# Patient Record
Sex: Female | Born: 1975 | Race: White | Hispanic: No | Marital: Married | Smoking: Never smoker
Health system: Southern US, Community
[De-identification: ages and names within clinical notes are randomized; demographics above are authoritative.]

## PROBLEM LIST (undated history)

## (undated) DIAGNOSIS — R000 Tachycardia, unspecified: Secondary | ICD-10-CM

## (undated) DIAGNOSIS — G90A Postural orthostatic tachycardia syndrome (POTS): Secondary | ICD-10-CM

## (undated) DIAGNOSIS — G43909 Migraine, unspecified, not intractable, without status migrainosus: Secondary | ICD-10-CM

## (undated) DIAGNOSIS — E162 Hypoglycemia, unspecified: Secondary | ICD-10-CM

## (undated) DIAGNOSIS — F32A Depression, unspecified: Secondary | ICD-10-CM

## (undated) DIAGNOSIS — G901 Familial dysautonomia [Riley-Day]: Secondary | ICD-10-CM

## (undated) DIAGNOSIS — F419 Anxiety disorder, unspecified: Secondary | ICD-10-CM

## (undated) DIAGNOSIS — S069XAA Unspecified intracranial injury with loss of consciousness status unknown, initial encounter: Secondary | ICD-10-CM

## (undated) HISTORY — PX: TUBAL LIGATION: SHX77

## (undated) HISTORY — DX: Depression, unspecified: F32.A

## (undated) HISTORY — PX: OOPHORECTOMY: SHX86

## (undated) HISTORY — PX: ABDOMINAL HYSTERECTOMY: SHX81

---

## 1998-01-20 ENCOUNTER — Ambulatory Visit (HOSPITAL_COMMUNITY): Admission: RE | Admit: 1998-01-20 | Discharge: 1998-01-20 | Payer: Self-pay | Admitting: Family Medicine

## 1998-03-20 ENCOUNTER — Inpatient Hospital Stay (HOSPITAL_COMMUNITY): Admission: AD | Admit: 1998-03-20 | Discharge: 1998-03-20 | Payer: Self-pay | Admitting: Obstetrics & Gynecology

## 1998-10-27 ENCOUNTER — Observation Stay (HOSPITAL_COMMUNITY): Admission: AD | Admit: 1998-10-27 | Discharge: 1998-10-28 | Payer: Self-pay | Admitting: Obstetrics & Gynecology

## 1998-12-26 ENCOUNTER — Ambulatory Visit (HOSPITAL_COMMUNITY): Admission: RE | Admit: 1998-12-26 | Discharge: 1998-12-26 | Payer: Self-pay | Admitting: Obstetrics and Gynecology

## 1998-12-26 ENCOUNTER — Encounter: Payer: Self-pay | Admitting: Obstetrics and Gynecology

## 1999-05-14 ENCOUNTER — Inpatient Hospital Stay (HOSPITAL_COMMUNITY): Admission: AD | Admit: 1999-05-14 | Discharge: 1999-05-17 | Payer: Self-pay | Admitting: Obstetrics and Gynecology

## 1999-11-17 ENCOUNTER — Other Ambulatory Visit: Admission: RE | Admit: 1999-11-17 | Discharge: 1999-11-17 | Payer: Self-pay | Admitting: Family Medicine

## 2001-02-19 ENCOUNTER — Other Ambulatory Visit: Admission: RE | Admit: 2001-02-19 | Discharge: 2001-02-19 | Payer: Self-pay | Admitting: Emergency Medicine

## 2001-06-01 ENCOUNTER — Emergency Department (HOSPITAL_COMMUNITY): Admission: EM | Admit: 2001-06-01 | Discharge: 2001-06-01 | Payer: Self-pay | Admitting: Emergency Medicine

## 2001-11-09 ENCOUNTER — Emergency Department (HOSPITAL_COMMUNITY): Admission: EM | Admit: 2001-11-09 | Discharge: 2001-11-09 | Payer: Self-pay | Admitting: Emergency Medicine

## 2001-11-11 ENCOUNTER — Emergency Department (HOSPITAL_COMMUNITY): Admission: EM | Admit: 2001-11-11 | Discharge: 2001-11-11 | Payer: Self-pay | Admitting: Emergency Medicine

## 2001-12-05 ENCOUNTER — Inpatient Hospital Stay (HOSPITAL_COMMUNITY): Admission: EM | Admit: 2001-12-05 | Discharge: 2001-12-07 | Payer: Self-pay | Admitting: Emergency Medicine

## 2002-02-17 ENCOUNTER — Observation Stay (HOSPITAL_COMMUNITY): Admission: AD | Admit: 2002-02-17 | Discharge: 2002-02-18 | Payer: Self-pay | Admitting: Obstetrics and Gynecology

## 2002-02-24 ENCOUNTER — Other Ambulatory Visit: Admission: RE | Admit: 2002-02-24 | Discharge: 2002-02-24 | Payer: Self-pay | Admitting: Obstetrics and Gynecology

## 2002-06-06 ENCOUNTER — Inpatient Hospital Stay (HOSPITAL_COMMUNITY): Admission: AD | Admit: 2002-06-06 | Discharge: 2002-06-06 | Payer: Self-pay | Admitting: Obstetrics and Gynecology

## 2002-08-17 ENCOUNTER — Encounter: Payer: Self-pay | Admitting: Obstetrics and Gynecology

## 2002-08-17 ENCOUNTER — Inpatient Hospital Stay (HOSPITAL_COMMUNITY): Admission: AD | Admit: 2002-08-17 | Discharge: 2002-08-17 | Payer: Self-pay | Admitting: Obstetrics and Gynecology

## 2002-08-23 ENCOUNTER — Inpatient Hospital Stay (HOSPITAL_COMMUNITY): Admission: AD | Admit: 2002-08-23 | Discharge: 2002-08-23 | Payer: Self-pay | Admitting: Obstetrics and Gynecology

## 2002-08-24 ENCOUNTER — Encounter: Payer: Self-pay | Admitting: Obstetrics and Gynecology

## 2002-08-24 ENCOUNTER — Inpatient Hospital Stay (HOSPITAL_COMMUNITY): Admission: AD | Admit: 2002-08-24 | Discharge: 2002-08-24 | Payer: Self-pay | Admitting: Obstetrics and Gynecology

## 2002-09-11 ENCOUNTER — Inpatient Hospital Stay (HOSPITAL_COMMUNITY): Admission: AD | Admit: 2002-09-11 | Discharge: 2002-09-14 | Payer: Self-pay | Admitting: Obstetrics and Gynecology

## 2002-09-13 ENCOUNTER — Encounter (INDEPENDENT_AMBULATORY_CARE_PROVIDER_SITE_OTHER): Payer: Self-pay | Admitting: Specialist

## 2003-08-27 ENCOUNTER — Emergency Department (HOSPITAL_COMMUNITY): Admission: EM | Admit: 2003-08-27 | Discharge: 2003-08-28 | Payer: Self-pay | Admitting: Emergency Medicine

## 2003-08-31 ENCOUNTER — Ambulatory Visit (HOSPITAL_COMMUNITY): Admission: RE | Admit: 2003-08-31 | Discharge: 2003-08-31 | Payer: Self-pay | Admitting: Family Medicine

## 2003-10-14 ENCOUNTER — Inpatient Hospital Stay (HOSPITAL_COMMUNITY): Admission: RE | Admit: 2003-10-14 | Discharge: 2003-10-18 | Payer: Self-pay | Admitting: Obstetrics and Gynecology

## 2003-10-14 ENCOUNTER — Encounter (INDEPENDENT_AMBULATORY_CARE_PROVIDER_SITE_OTHER): Payer: Self-pay | Admitting: Specialist

## 2004-03-29 ENCOUNTER — Ambulatory Visit (HOSPITAL_COMMUNITY): Admission: RE | Admit: 2004-03-29 | Discharge: 2004-03-29 | Payer: Self-pay | Admitting: Obstetrics and Gynecology

## 2004-04-02 ENCOUNTER — Emergency Department (HOSPITAL_COMMUNITY): Admission: EM | Admit: 2004-04-02 | Discharge: 2004-04-03 | Payer: Self-pay | Admitting: Emergency Medicine

## 2004-04-28 ENCOUNTER — Emergency Department (HOSPITAL_COMMUNITY): Admission: EM | Admit: 2004-04-28 | Discharge: 2004-04-28 | Payer: Self-pay | Admitting: Emergency Medicine

## 2004-05-07 ENCOUNTER — Emergency Department (HOSPITAL_COMMUNITY): Admission: EM | Admit: 2004-05-07 | Discharge: 2004-05-07 | Payer: Self-pay | Admitting: Emergency Medicine

## 2004-10-12 ENCOUNTER — Emergency Department (HOSPITAL_COMMUNITY): Admission: EM | Admit: 2004-10-12 | Discharge: 2004-10-12 | Payer: Self-pay | Admitting: Emergency Medicine

## 2005-03-07 ENCOUNTER — Other Ambulatory Visit: Admission: RE | Admit: 2005-03-07 | Discharge: 2005-03-07 | Payer: Self-pay | Admitting: Obstetrics and Gynecology

## 2005-03-28 ENCOUNTER — Ambulatory Visit (HOSPITAL_COMMUNITY): Admission: RE | Admit: 2005-03-28 | Discharge: 2005-03-28 | Payer: Self-pay | Admitting: Obstetrics and Gynecology

## 2005-11-05 IMAGING — US US TRANSVAGINAL NON-OB
1 series · 18 of 25 positions shown · non-contrast
Comparison: none

CLINICAL DATA: Pelvic pain.
 TRANSABDOMINAL AND TRANSVAGINAL PELVIC ULTRASOUND:
 The uterus is normal in size measuring approximately 9.3 cm in length.  No fibroids or other uterine masses are seen.  The endometrium has a double-layer thickness of 7 mm on transvaginal sonography which is within normal limits for a premenopausal patient.
 A complex mass is seen in the left adnexal which has both cystic and solid components.  The solid component is hyperechoic with acoustic shadowing, consistent with internal fat.  No left ovary is identified separate from this mass by either transabdominal or transvaginal sonography.  This mass measures 7.1 x 5.0 x 6.1 cm and is consistent with a left ovarian dermoid.  
 The right ovary was visualized and is normal in appearance.  There is no evidence of free pelvic fluid.  
 IMPRESSION
 Complex mass in the left adnexal measuring 7 cm, which has features consistent with a left ovarian dermoid.  
 Normal appearance of the uterus and right ovary.

[Series 1: us pelvis complete modify · 18 of 47 slices shown]
[im 1/47]
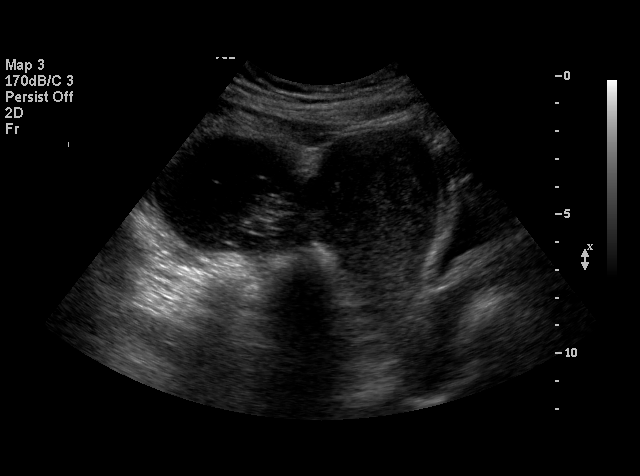
[im 4/47]
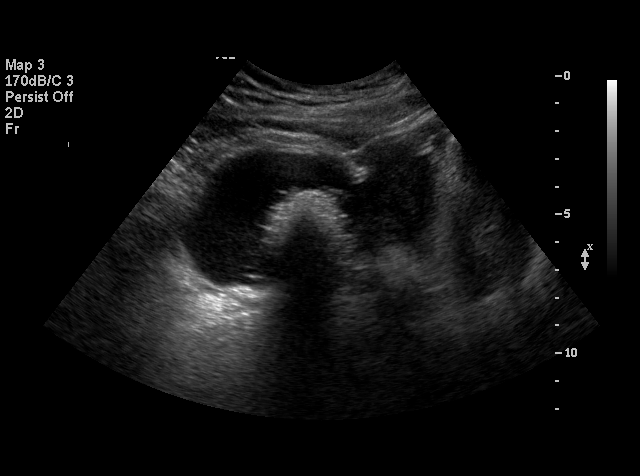
[im 6/47]
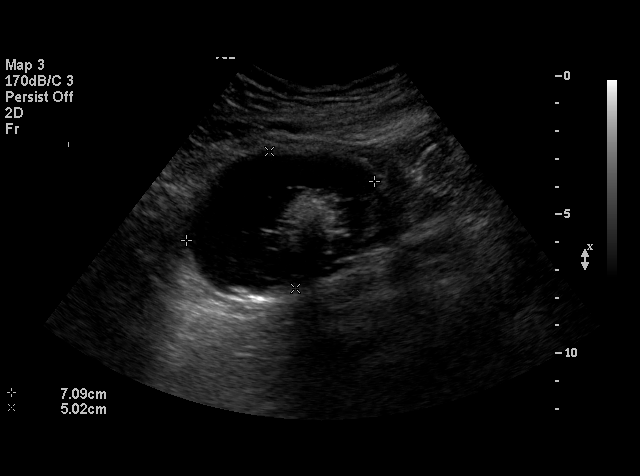
[im 8/47]
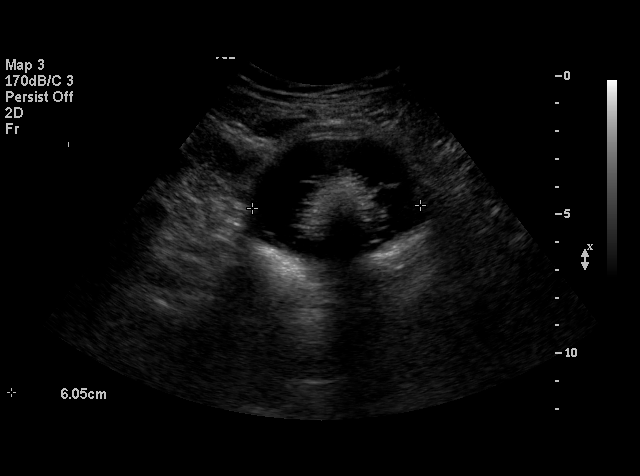
[im 12/47]
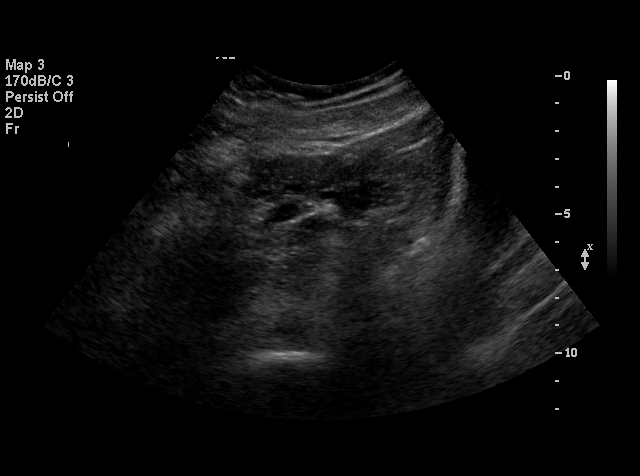
[im 14/47]
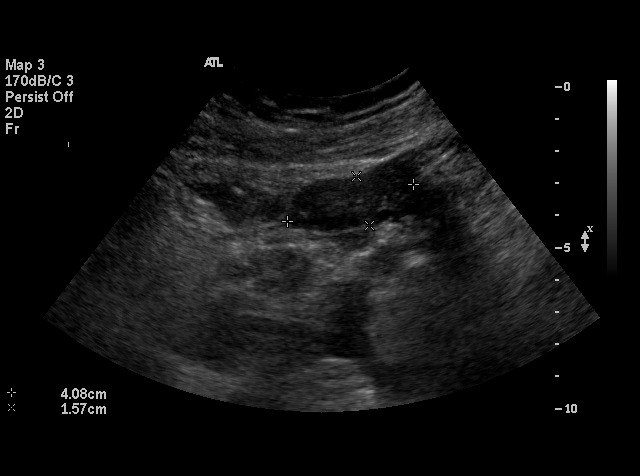
[im 18/47]
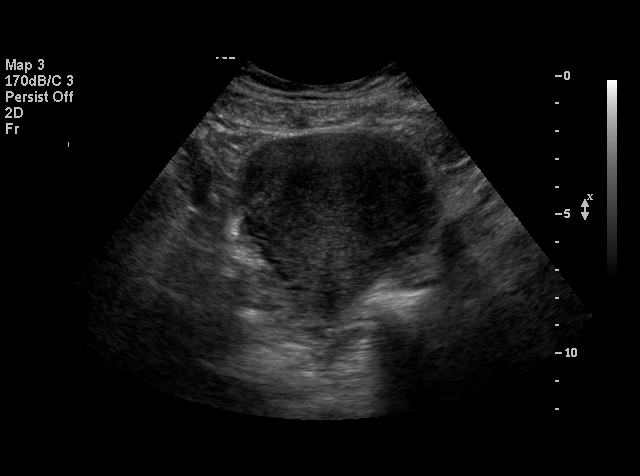
[im 20/47]
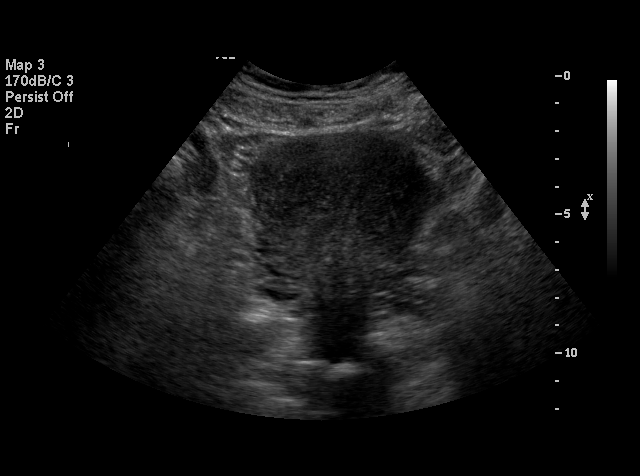
[im 22/47]
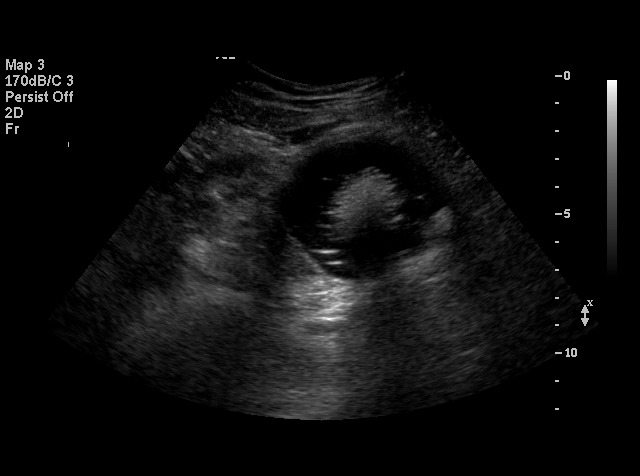
[im 25/47]
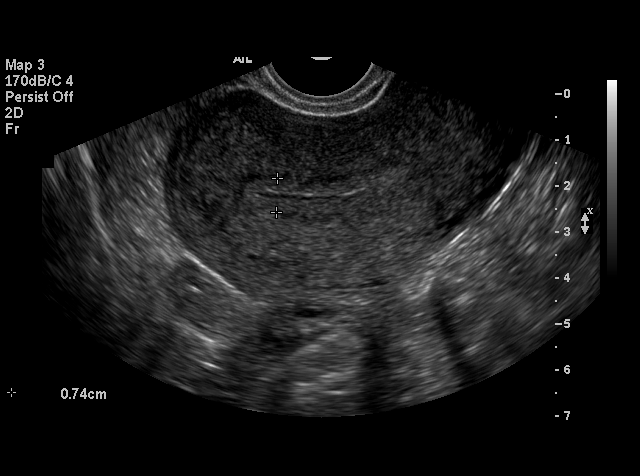
[im 27/47]
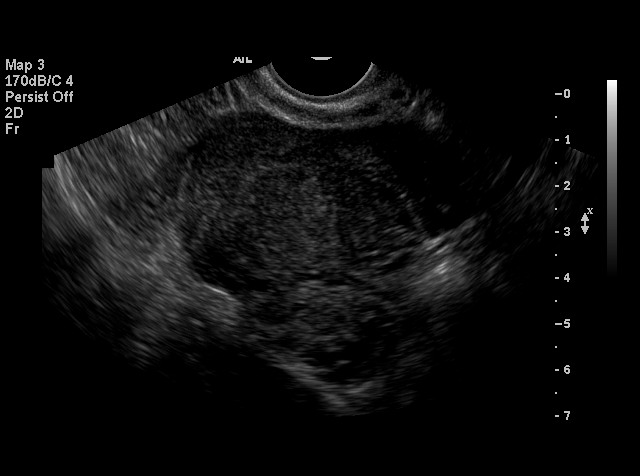
[im 29/47]
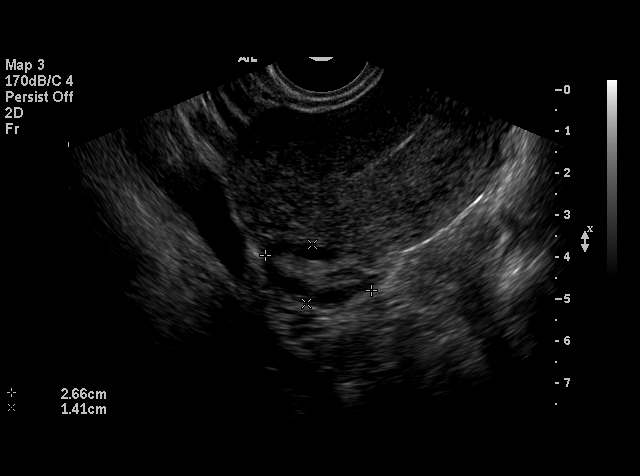
[im 33/47]
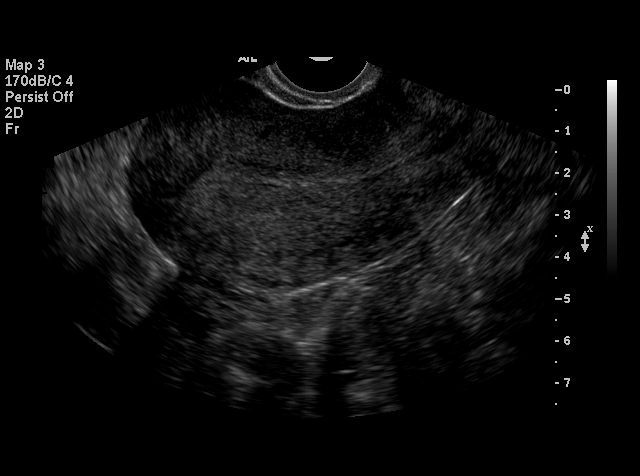
[im 35/47]
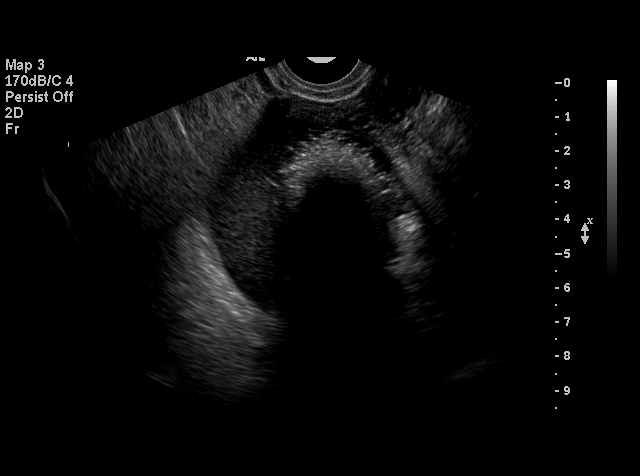
[im 39/47]
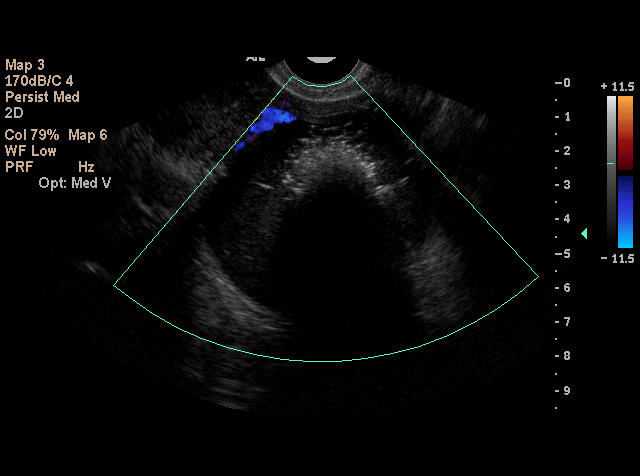
[im 41/47]
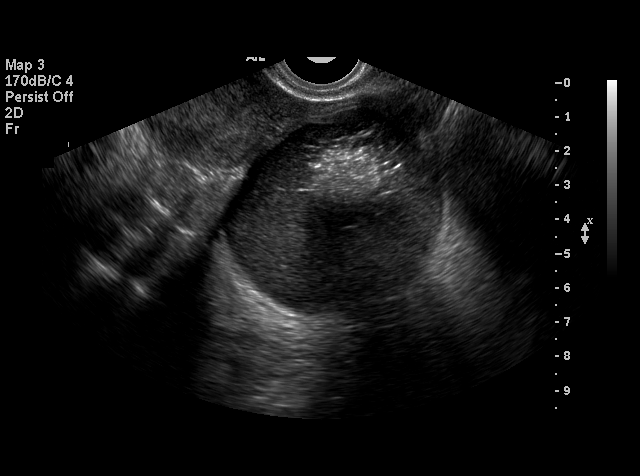
[im 43/47]
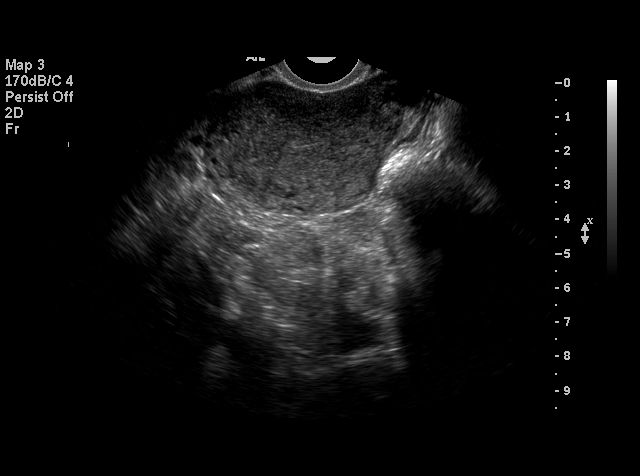
[im 47/47]
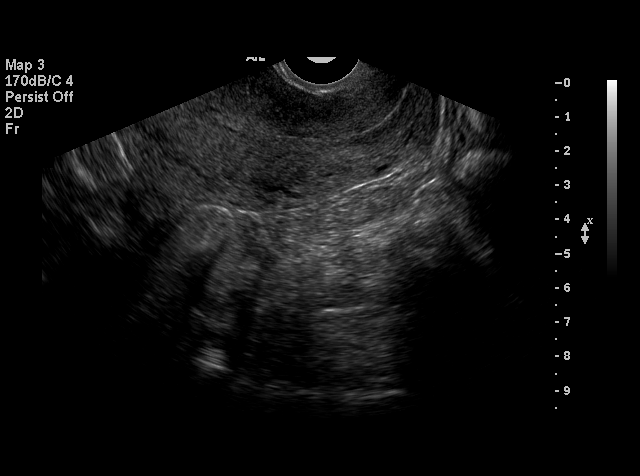

[18 of 25 positions shown; findings below may reference images not displayed]

## 2006-06-04 IMAGING — US US PELVIS COMPLETE MODIFY
1 series · 18 of 25 positions shown · non-contrast
Comparison: none

CLINICAL DATA: 28 year-old with abdominal pain
 TRANSVAGINAL NON-OB AND PELVIS COMPLETE MODIFIED
 Transabdominal and endovaginal images are performed of the pelvis.  Comparison is made with study on 08/31/03.  Patient reports having had a left ovarian tumor removed since that study.  The uterus is 9.7 x 5.1 x 5.7cm.  The endometrial stripe is homogeneous and measures 10mm.  
 Right ovary has a normal appearance measuring 7 x 1.9 x 2.5cm.  The left ovary could not be visualized.  No free pelvic fluid or adnexal mass identified.
 IMPRESSION
 Normal-appearing uterus and right ovary.  The left ovary is not visualized.

[Series 1: us transvaginal non-ob · 18 of 27 slices shown]
[im 1/27]
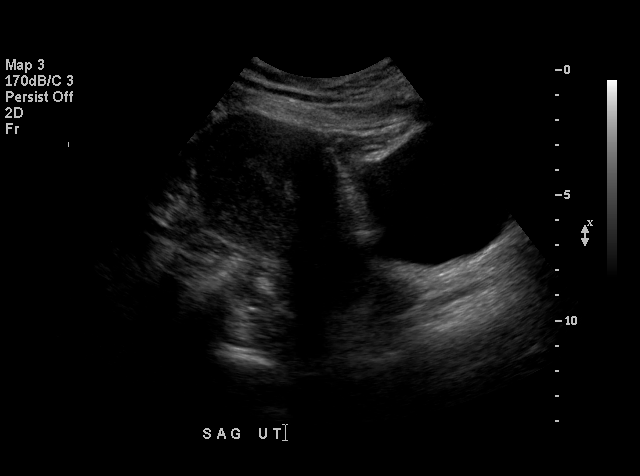
[im 3/27]
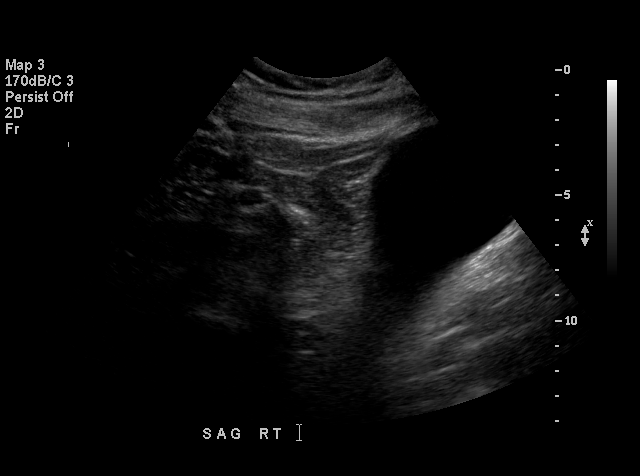
[im 4/27]
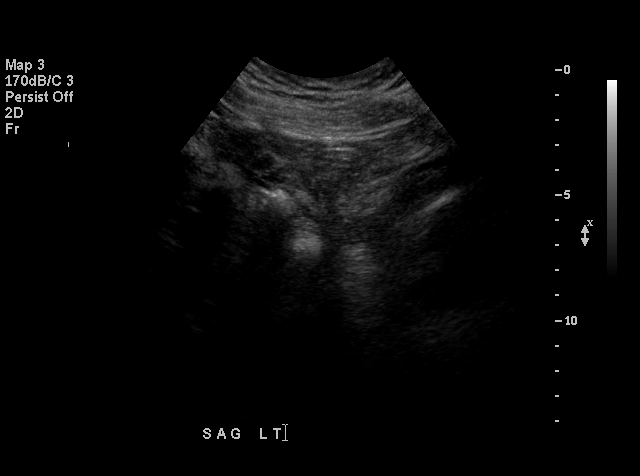
[im 5/27]
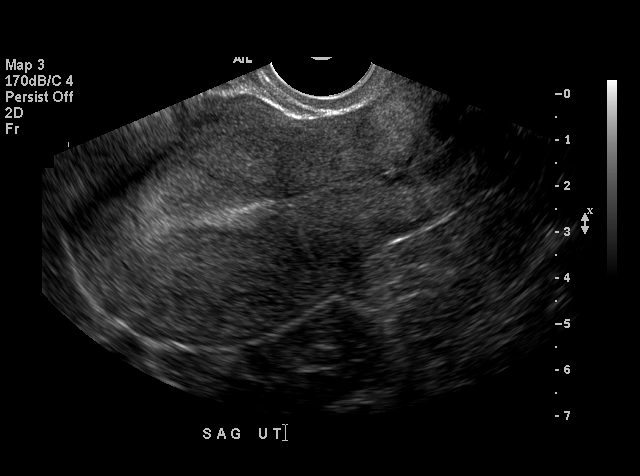
[im 7/27]
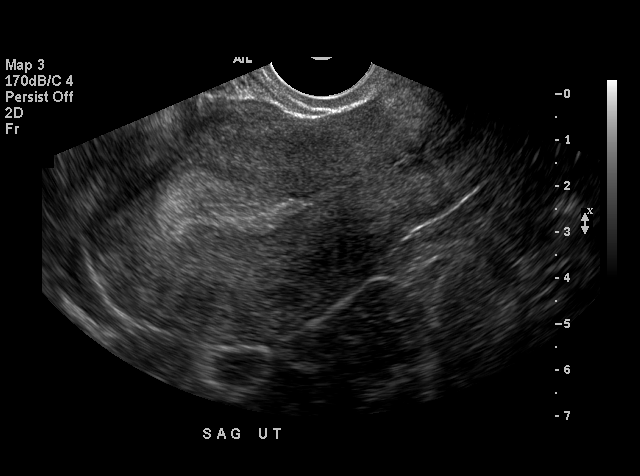
[im 8/27]
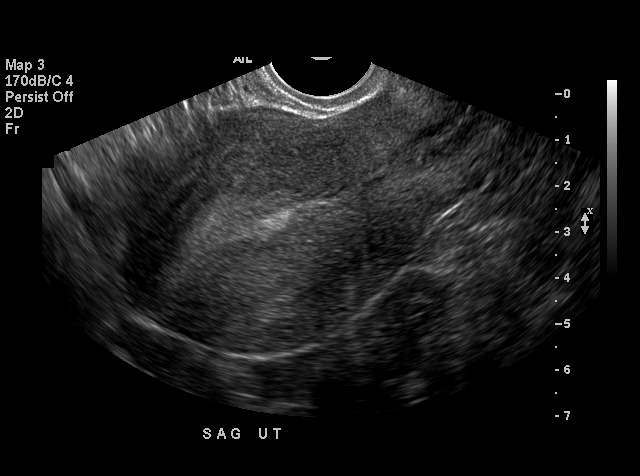
[im 10/27]
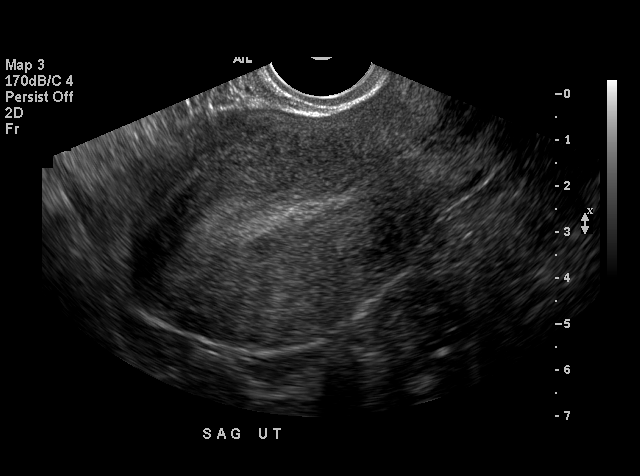
[im 11/27]
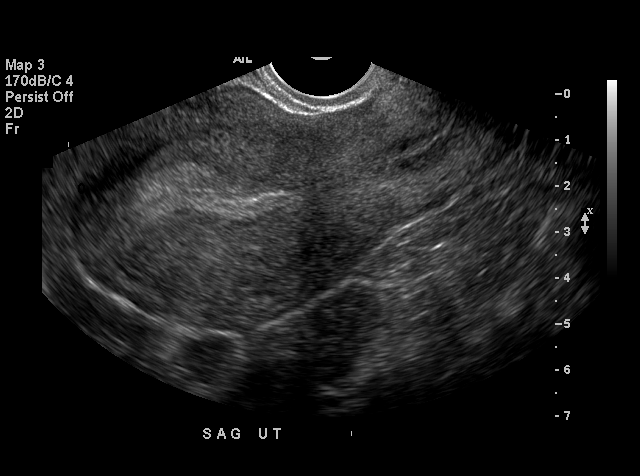
[im 12/27]
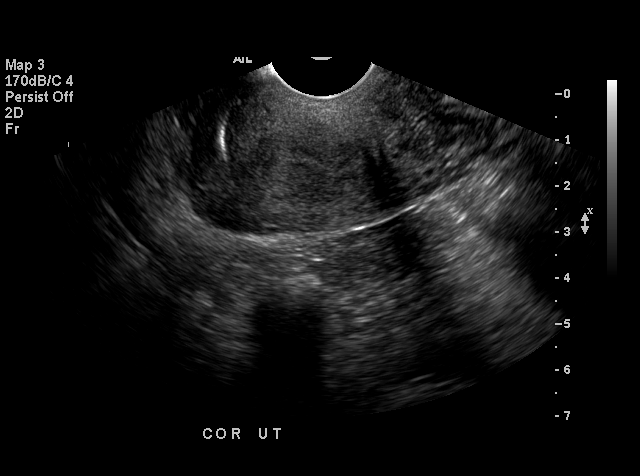
[im 15/27]
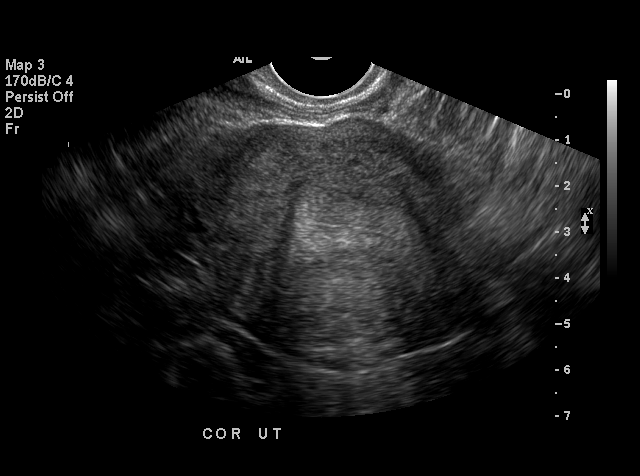
[im 16/27]
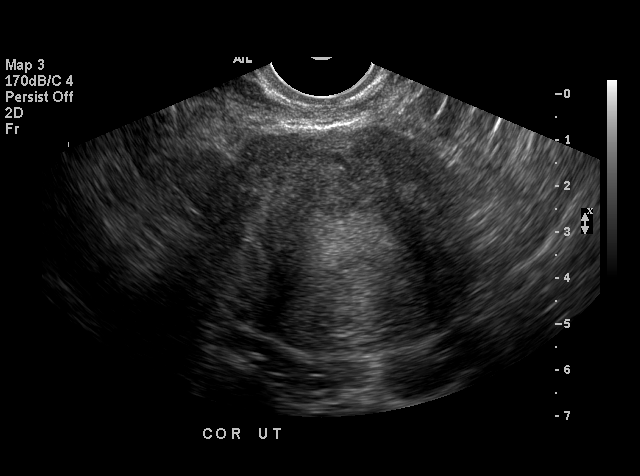
[im 17/27]
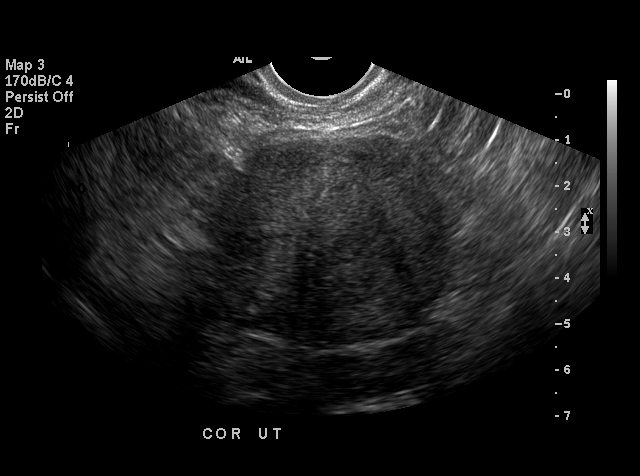
[im 19/27]
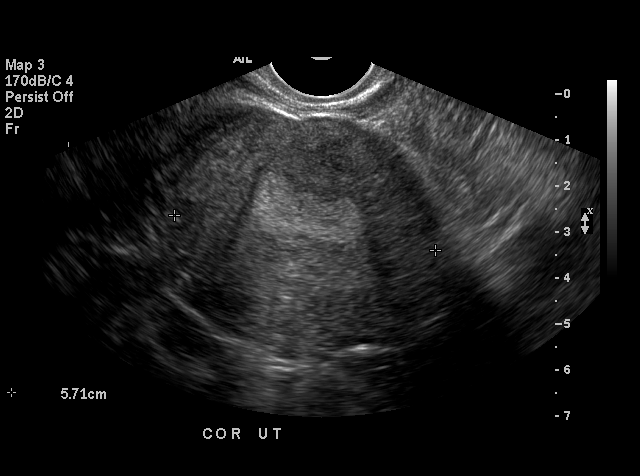
[im 20/27]
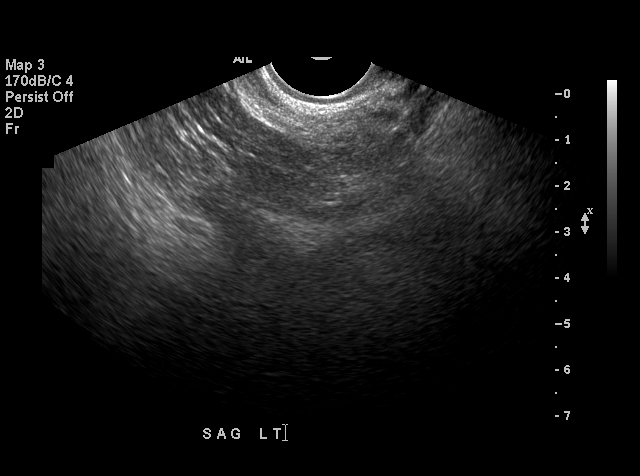
[im 22/27]
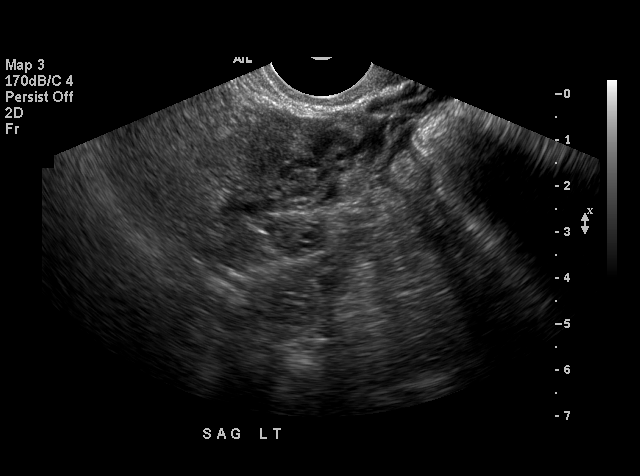
[im 23/27]
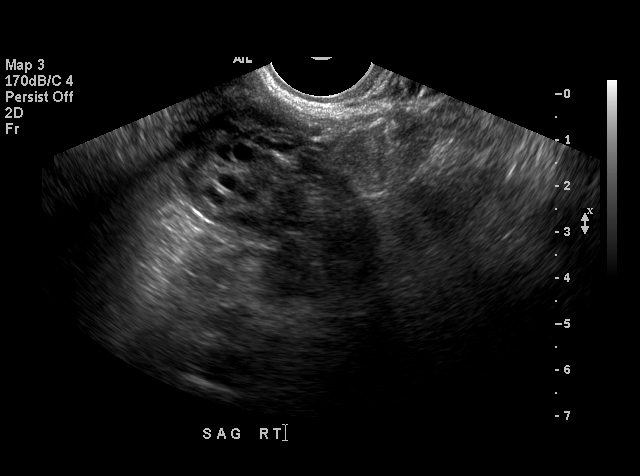
[im 24/27]
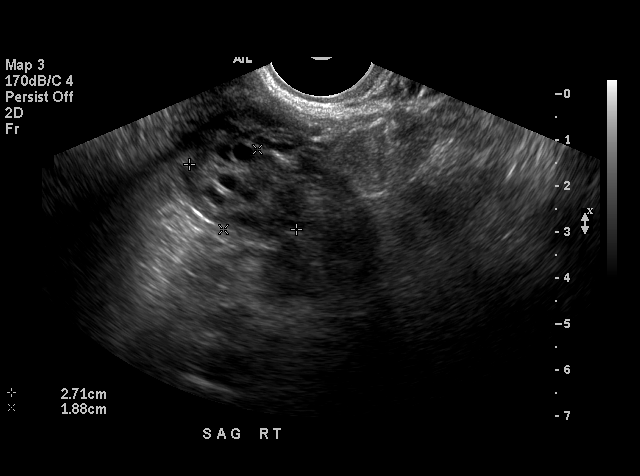
[im 27/27]
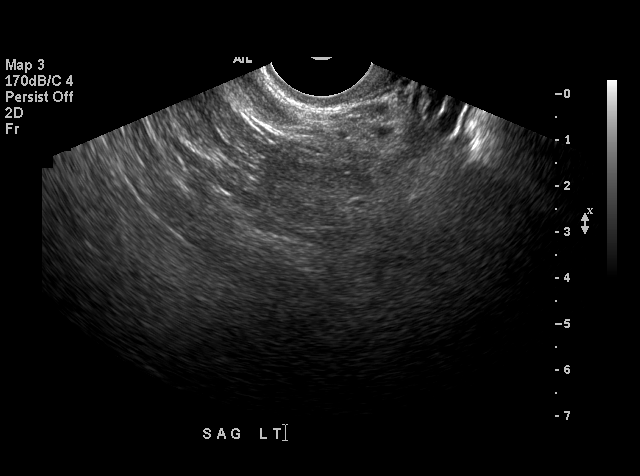

[18 of 25 positions shown; findings below may reference images not displayed]

## 2009-02-04 ENCOUNTER — Emergency Department (HOSPITAL_COMMUNITY): Admission: EM | Admit: 2009-02-04 | Discharge: 2009-02-04 | Payer: Self-pay | Admitting: Emergency Medicine

## 2009-06-15 ENCOUNTER — Emergency Department (HOSPITAL_BASED_OUTPATIENT_CLINIC_OR_DEPARTMENT_OTHER): Admission: EM | Admit: 2009-06-15 | Discharge: 2009-06-15 | Payer: Self-pay | Admitting: Emergency Medicine

## 2010-06-08 ENCOUNTER — Encounter (INDEPENDENT_AMBULATORY_CARE_PROVIDER_SITE_OTHER): Payer: Self-pay | Admitting: Obstetrics and Gynecology

## 2010-06-08 ENCOUNTER — Ambulatory Visit (HOSPITAL_COMMUNITY): Admission: RE | Admit: 2010-06-08 | Discharge: 2010-06-09 | Payer: Self-pay | Admitting: Obstetrics and Gynecology

## 2010-10-22 ENCOUNTER — Encounter: Payer: Self-pay | Admitting: Obstetrics and Gynecology

## 2010-12-14 LAB — CBC
HCT: 32.7 % — ABNORMAL LOW (ref 36.0–46.0)
HCT: 39 % (ref 36.0–46.0)
Hemoglobin: 13.3 g/dL (ref 12.0–15.0)
MCHC: 33.9 g/dL (ref 30.0–36.0)
MCHC: 34.1 g/dL (ref 30.0–36.0)
MCV: 92.1 fL (ref 78.0–100.0)
Platelets: 226 10*3/uL (ref 150–400)
RBC: 4.24 MIL/uL (ref 3.87–5.11)
RDW: 13.3 % (ref 11.5–15.5)
WBC: 11.3 10*3/uL — ABNORMAL HIGH (ref 4.0–10.5)

## 2010-12-14 LAB — URINALYSIS, ROUTINE W REFLEX MICROSCOPIC
Bilirubin Urine: NEGATIVE
Nitrite: NEGATIVE
Specific Gravity, Urine: >1.03 — ABNORMAL HIGH (ref 1.005–1.030)
pH: 5.5 (ref 5.0–8.0)

## 2010-12-14 LAB — URINE MICROSCOPIC-ADD ON

## 2010-12-14 LAB — PREGNANCY, URINE: Preg Test, Ur: NEGATIVE

## 2011-01-05 LAB — URINALYSIS, ROUTINE W REFLEX MICROSCOPIC
Bilirubin Urine: NEGATIVE
Leukocytes, UA: NEGATIVE
Nitrite: NEGATIVE
Specific Gravity, Urine: 1.022 (ref 1.005–1.030)
Urobilinogen, UA: 0.2 mg/dL (ref 0.0–1.0)

## 2011-01-05 LAB — PREGNANCY, URINE: Preg Test, Ur: NEGATIVE

## 2011-01-05 LAB — URINE MICROSCOPIC-ADD ON

## 2011-01-09 LAB — URINALYSIS, ROUTINE W REFLEX MICROSCOPIC
Nitrite: NEGATIVE
pH: 5.5 (ref 5.0–8.0)

## 2011-01-09 LAB — PREGNANCY, URINE: Preg Test, Ur: NEGATIVE

## 2011-02-16 NOTE — H&P (Signed)
[facility]  Patient:    TELESA, JEANCHARLES Visit Number: [id number] MRN: [id number]          Service Type: OBS Location: 9300 [id number] Attending Physician:  Leonard Schwartz Dictated by:   Nigel Bridgeman, C.N.M. Admit Date:  02/17/2002                           History and Physical  HISTORY OF PRESENT ILLNESS:   Ms. Donald Pore is a 35 year old, gravida 4, para 2-0-1-2, at 9-2/7 weeks, who is admitted for 23-hour observation secondary to severe nausea and vomiting.  The patient has had nausea and vomiting this pregnancy, but has had no benefit in the last 48 hours from the Phenergan suppositories that she was prescribed.  She also describes a slight burning chest pain just under her left breast extending up the sternum.  She denies any dysuria, vaginal bleeding, or discharge.  She had nausea and vomiting the last two pregnancies, but reports it is "worse this time."  She has a new OB appointment next Tuesday at [facility].  LABORATORY DATA:              Clean catch urine shows a specific gravity of 1.034, small hemoglobin, small bilirubin, great than 80 ketones, positive ICTO test, and urobilinogen of 1.  HISTORY OF PRESENT PREGNANCY: The patients last menstrual period was December 14, 2001, and this was a normal period, which would make her approximately 9-2/7 weeks.  She reports that nausea and vomiting began early in this pregnancy over the last two to three weeks and has worsened to this point.  OBSTETRICAL HISTORY:          She has had two previous vaginal births and one spontaneous miscarriage.  She did have a history of nausea and vomiting with her last two pregnancies.  ALLERGIES:                    She has no known medication allergies.  PAST MEDICAL HISTORY:         Unremarkable.  There is no history of thyroid problems, diabetes, or hypertension.  She has also never had any surgery. Only other hospitalizations were for  childbirth x 2.  SOCIAL HISTORY:               She is single.  She lives with her children. She is currently unemployed.  FAMILY HISTORY:               Noncontributory.  PHYSICAL EXAMINATION:         The initial temperature was 100 degrees with a repeat temperature approximately one to two hours later of 99.1 degrees.  The pulse is 81, respirations are 20, and the blood pressure is 149/78.  GENERAL APPEARANCE:           A cachectic white female who appears ill.  HEENT:                        Within normal limits.  LUNGS:                        Bilateral breath sounds are clear.  HEART:                        Regular rate and rhythm without murmur.  BREASTS:  Soft and nontender.  ABDOMEN:                      Soft and nontender with a fundal height of approximately 9 weeks size.  Fetal heart tones were not able to be auscultated, but the uterus appeared to be normal size.  There is no guarding, rebound, or right upper quadrant tenderness.  BACK:                         Negative CVA tenderness.  PELVIC:                       The external genitalia examination is deferred.  SKIN:                         Very dry.  The patients lips are very dry.  EXTREMITIES:                  The deep tendon reflexes are 2+ without clonus. There is trace edema noted.  IMPRESSION:                   1. Early pregnancy.                               2. Hyperemesis.                               3. Probable reflux or esophagitis.  PLAN:                         1. Admit to the third floor for consult with                                  Janine Limbo, M.D., as attending                                  physician for 23-hour observation secondary                                  to hyperemesis.                               2. IV fluids will be continued through the night                                  of LR at 200 cc/hr.                               3. Phenergan 12.5  mg IV q.6h. p.r.n. nausea.                               4. Will continue to monitor vital signs and the  issue of chest pain.                               5. Will reevaluate status in a.m. Dictated by:   Nigel Bridgeman, C.N.M. Attending Physician:  Leonard Schwartz DD:  02/17/02 TD:  02/17/02 Job: [id number]

## 2011-02-16 NOTE — Discharge Summary (Signed)
[facility]  Patient:    Laura Simpson, Laura Simpson Visit Number: [id number] MRN: [id number]          Service Type: OBS Location: 9300 [id number] [id number] Attending Physician:  Leonard Schwartz Dictated by:   Mack Guise, C.N.M. Admit Date:  02/17/2002 Discharge Date: 02/18/2002                             Discharge Summary  ADMITTING DIAGNOSES:          1. Intrauterine pregnancy at 9 weeks.                               2. Hyperemesis.  DISCHARGE DIAGNOSES:          1. Intrauterine pregnancy at 9 weeks.                               2. Hyperemesis.  HISTORY OF PRESENT ILLNESS:   Ms. Donald Pore is a 35 year old gravida 4, para 2-0-1-2 who presents at [redacted] weeks gestation for 23-hour observation secondary to severe nausea and vomiting and greater than 80 mg of ketones in her urine.  She is admitted for IV hydration with Phenergan.  She states she does feel much better than yesterday on admission but still has a significant amount of nausea.  This a.m., she is able to keep down fluids.  Her medication, per Dr. Dois Davenport Rivard, will be switched to Zofran.  She will receive one does of 4 mg IV prior to discharge and then an Rx of 4 mg p.o. q.8h. to go home with.  She will follow up for her new OB visit at [facility] on Tuesday, Feb 24, 2002 and call with any further problems with nausea and vomiting. Dictated by:   Mack Guise, C.N.M. Attending Physician:  Leonard Schwartz DD:  02/18/02 TD:  02/20/02 Job: [id number]

## 2011-02-16 NOTE — Op Note (Signed)
NAME:  LEOTTA, WEINGARTEN                         ACCOUNT NO.:  [id number]   MEDICAL RECORD NO.:  [id number]                   PATIENT TYPE:  INP   LOCATION:  9145                                 FACILITY:  [facility]   PHYSICIAN:  Osborn Coho, M.D.                DATE OF BIRTH:  [birth year]   DATE OF PROCEDURE:  09/13/2002  DATE OF DISCHARGE:                                 OPERATIVE REPORT   PREOPERATIVE DIAGNOSES:  1. Postpartum day #1 status post normal spontaneous vaginal delivery.  2. Desires elective permanent sterilization.   POSTOPERATIVE DIAGNOSES:  1. Postpartum day #1 status post normal spontaneous vaginal delivery.  2. Desires elective permanent sterilization.   PROCEDURE:  Postpartum bilateral tubal ligation.   ATTENDING:  Osborn Coho, M.D.   ANESTHESIA:  General.   ESTIMATED BLOOD LOSS:  Minimal, less than 5 cc.   FLUIDS:  1,000 cc.   URINE OUTPUT:  Voided prior to procedure.   COMPLICATIONS:  None.   FINDINGS:  Normal bilateral fallopian tubes, carried out to fimbriated end  bilaterally.   PATHOLOGY:  Bilateral portions of fallopian tubes sent.   PROCEDURE:  The patient was taken to the operating room after the risks,  benefits, and alternatives were discussed with the patient and consent  reaffirmed.  The patient was given a surgical bolus through the epidural.  The patient was prepped and draped in a normal sterile fashion and in the  dorsal supine position.  A 10 mm infraumbilical and approximately 10 mm  infraumbilical incision were carried down to the underlying layer of the  fascia, which was excised with the Metzenbaum scissors.  The peritoneum was  identified and excised with the Metzenbaum scissors.  The right fallopian  tube was identified and carried out to its fimbriated end.  The tube was  then grasped in the midportion with the Babcock and ligated x2 with 0 plain  ties.  A length of approximately 3 cm was excised and sent off to pathology.  The remaining portion of the tube was cauterized with the Bovie.  Good  hemostasis was noted, and the tube was returned to the intra-abdominal  cavity.  Attention was then turned to the left fallopian tube, which in a  similar manner was carried out to its fimbriated end.  A Babcock was placed  in the midportion of the tube and ligated x2 with 1 plain ties.  A length of  approximately 3 cm was excised with the Metzenbaum scissors and a portion of  the tube also sent to pathology.  The remaining portion of the tube was  cauterized with the Bovie for  hemostasis.  Good hemostasis was noted, and the tube was returned to the  intra-abdominal cavity.  The fascia was repaired with 0 Vicryl in a running  fashion.  A subcuticular stitch was performed using 3-0 Monocryl.  The  patient tolerated the procedure well and  was returned to the recovery room  in stable condition.                                               Osborn Coho, M.D.    AR/MEDQ  D:  09/13/2002  T:  09/13/2002  Job:  [id number]

## 2011-02-16 NOTE — H&P (Signed)
Laura Simpson, AHART NO.:  [id number]   MEDICAL RECORD NO.:  [id number]          PATIENT TYPE:  AMB   LOCATION:  SDC                           FACILITY:  [facility]   PHYSICIAN:  Janine Limbo, M.D.DATE OF BIRTH:  11-01-1975   DATE OF ADMISSION:  DATE OF DISCHARGE:                                HISTORY & PHYSICAL   HISTORY OF PRESENT ILLNESS:  Ms. Laura Simpson is a 35 year old female, para 3-0-1-  3, who presents for diagnostic laparoscopy.  The patient complains of  chronic pelvic pain that has not been relieved by nonsteroidal anti-  inflammatory agents.  The patient had an abdominal CT scan done as well as a  pelvic CT scan both showed normal organs.  She had an ultrasound performed  that showed a normal appearing uterus and normal right ovary.  The left  ovary could not be visualized.  The patient has a negative gonorrhea culture  and Chlamydia culture.  Her STD screen was negative.  Her other laboratory  values were normal.  Her most recent Pap smear was in June 2006, and it was  within normal limits.  In January 2005, the patient had a left dermoid cyst  removed that measured 7-cm in size.  The remainder of the pelvis and the  abdominal contents appeared normal.   OBSTETRICAL HISTORY:  1.  In 1995, the patient had a miscarriage in the first trimester.  2.  In 1997, the patient had a term vaginal delivery of a 7 pound 15 ounce      female infant.  3.  In 2000, the patient had a term vaginal delivery of an 8 pound 6 ounce      female infant.  4.  In 2003, the patient had a vaginal delivery at term of a 7 pound 2 ounce      female infant.   DRUG ALLERGIES:  No known drug allergies.   PAST MEDICAL HISTORY:  The patient has a past history of depression.   SOCIAL HISTORY:  The patient denies cigarette use, alcohol use, and  recreational drug use.   REVIEW OF SYSTEMS:  Please see history of present illness.   FAMILY HISTORY:  Noncontributory.   PHYSICAL  EXAMINATION:  VITAL SIGNS:  Weight is 200 pounds, height is 5 feet  10 inches.  HEENT:  Within normal limits.  CHEST:  Clear.  HEART:  Regular rate and rhythm.  BREASTS:  Without masses.  BACK:  Nontender.  ABDOMEN:  Diffusely tender but there is no guarding or rebound.  No masses  or organomegaly are appreciated.  EXTREMITIES:  Within normal limits.  NEUROLOGIC:  Grossly normal.  PELVIC:  External genitalia is normal.  The vagina is normal.  Cervix is  nontender and no lesions are present.  The uterus is normal size, shape, and  consistency.  There is tenderness at the fundus.  Adnexa:  No masses  appreciated.  There is left adnexal tenderness.  Rectovaginal exam confirms  the above.   ASSESSMENT:  1.  Pelvic pain.  2.  Exploratory laparotomy with a left dermoid ovarian  cystectomy.   PLAN:  The patient will undergo a diagnostic laparoscopy.  She understands  the indications for her surgical procedure, and she accepts the risk of, but  not limited to, anesthetic complications, bleeding, infection, and possible  damage to the surrounding organs.       AVS/MEDQ  D:  03/27/2005  T:  03/28/2005  Job:  [id number]

## 2011-02-16 NOTE — Op Note (Signed)
[name], SCARPATI NO.:  [id number]   MEDICAL RECORD NO.:  [id number]          PATIENT TYPE:  AMB   LOCATION:  SDC                           [facility]ACILITY:  [facility]   PHYSICIAN:  Janine Limbo, M.D.DATE OF BIRTH:  [birth year]   DATE OF PROCEDURE:  03/28/2005  DATE OF DISCHARGE:                                 OPERATIVE REPORT   PREOPERATIVE DIAGNOSIS:  Pelvic pain.   POSTOPERATIVE DIAGNOSES:  1.  Pelvic pain.  2.  Pelvic adhesions.   PROCEDURE:  1.  Diagnostic laparoscopy.  2.  Laparoscopic lysis of adhesions.   SURGEON:  Dr. Leonard Schwartz.   ANESTHETIC:  General.   DISPOSITION:  Laura Simpson is a 35 year old female, para 3-0-1-3, who  presents with pelvic pain.  She has had a left ovarian cystectomy for  removal of a dermoid.  She understands the indications for her surgical  procedure and she accepts the risk of, but not limited to, anesthetic  complications, bleeding, infections, and possible damage to the surrounding  organs.  She understands that no guarantees can be given concerning the  total relief of her discomfort.   FINDINGS:  The uterus was upper limits of normal size.  The fallopian tubes  were normal bilaterally except for scars from her prior tubal ligation.  Her  left ovary was densely adhered to the left pelvic sidewall.  The right ovary  appeared normal.  The anterior cul-de-sac and the posterior cul-de-sac were  completely free of disease.  The bowel appeared normal.  The liver was  carefully inspected and it appeared normal.  The appendix appeared normal.   PROCEDURE:  The patient was taken to the operating room, where a general  anesthetic was given.  The patient's abdomen, perineum, and vagina were  prepped with multiple layers of Betadine.  A Hulka tenaculum was placed  inside the uterus after an examination under anesthesia.  A Foley catheter  was placed in the bladder.  The patient was sterilely draped.  The  subumbilical area was injected with 7 mL of 0.5% Marcaine with epinephrine.  A subumbilical incision was made and the incision was extended sharply  through the subcutaneous tissue, the fascia, and the anterior peritoneum.  The Hulka tenaculum was sutured into place using 2-0 Vicryl.  A  pneumoperitoneum was obtained.  The pelvis was carefully inspected.  Two  suprapubic areas were injected with 0.5% Marcaine with epinephrine.  Two  small incisions were made and two 5 mm trocars were placed in the lower  abdomen under direct visualization.  Again the pelvis was carefully  inspected.  Pictures were taken.  The adhesions between the left ovary and  the left pelvic sidewall were first cauterized and then cut using the  laparoscopic scissors.  Care was taken not to damage any of the vital  underlying structures.  At the end of our procedure, the ovary was  completely free.  Hemostasis was adequate.  The pelvis was vigorously  irrigated and again hemostasis was confirmed.  Additional pictures were  taken.  At this point we felt we were  ready to terminate our procedure.  All  instruments were removed and the subumbilical fascia was closed using figure-  of-eight sutures of 2-0 Vicryl.  The skin sutures were reapproximated using  4-0 Monocryl.  The sponge, needle, and instrument counts were correct on two  occasions.  The estimated blood loss for the procedure was 5 mL.  The  patient tolerated her procedure well.  She was taken to the recovery room in  stable condition.   FOLLOW-UP INSTRUCTIONS:  The patient was given a prescription for Tylenol  #3, and she will take one or two tablets every three to four hours as needed  for pain.  She will also take ibuprofen 600 mg every six hours as needed for  pain.  She will call for questions or concerns.  She will return to see Dr.  Stefano Gaul in two to three weeks for a follow-up examination.  She was given a  copy of the postoperative instruction sheet as  prepared by the [facility] for patients who have undergone laparoscopy.       AVS/MEDQ  D:  03/28/2005  T:  03/28/2005  Job:  [id number]

## 2011-02-16 NOTE — Discharge Summary (Signed)
NAME:  Laura Simpson, Laura Simpson                         ACCOUNT NO.:  [id number]   MEDICAL RECORD NO.:  [id number]                   PATIENT TYPE:  INP   LOCATION:  9145                                 FACILITY:  [facility]   PHYSICIAN:  Janine Limbo, M.D.            DATE OF BIRTH:  09/27/1976   DATE OF ADMISSION:  09/11/2002  DATE OF DISCHARGE:  09/14/2002                                 DISCHARGE SUMMARY   ADMITTING DIAGNOSES:  1. Intrauterine pregnancy at 70 and 2/7 weeks.  2. Early labor.  3. Desires tubal.   DISCHARGE DIAGNOSES:  1. Intrauterine pregnancy at 9 and 2/7 weeks.  2. Early labor.  3. Desires tubal.   HOSPITAL COURSE:  The patient is a 35 year old gravida 4, para 2-0-1-2 at 68  and 2/7 weeks who was admitted in early labor on September 11, 2002 in the  late evening.  Pregnancy had been remarkable for desiring tubal  sterilization with papers signed on July 06, 2002, previous preterm labor  with a term delivery, history of depression (patient currently on Paxil),  history of hyperemesis, history of irregular cycles.  On admission patient  was 3-4 cm.  She requested an epidural which was placed.  Artifical rupture  of the membranes was accomplished at 12:50 a.m. with clear fluid noted.  The  patient progressed well to fully dilated at 4:02 a.m. with delivery at 4:22  a.m.  The patient had a viable female by the name of Vernona Rieger, weight 7 pounds  2 ounces.  Apgars were 8 and 9.  There were some scattered perineal  abrasions and a first degree perineal laceration.  Infant was taken to the  full-term nursery.  Mother was taken to recovery room in good condition.  Tubal was planned while epidural was maintained.  The patient did have a  temperature of 100.3 in the morning of September 13, 2002.  She was bottle  feeding.  It was decided to proceed with the tubal.  Her hemoglobin was  10.3.  Her WBC count was 4.9.  Platelets were 148,000.  Dr. Osborn Coho  performed the tubal  under existing epidural anesthesia.  The patient  tolerated the procedure well.  There were no complications.  Case management  did see the patient secondary to history of depression.  By postpartum day  two, postoperative day one patient was doing well.  She was up at lib.  She  was having some mild pain with her incisions, but was tolerating a regular  diet and the pain was being controlled well with p.o. pain medication.  She  was deemed to have received full benefit of the hospital stay and was  discharged home.   DISCHARGE INSTRUCTIONS:  Per [facility] handout.   DISCHARGE MEDICATIONS:  1. Motrin 600 mg p.o. q.6h. p.r.n. pain.  2. Tylox one to two p.o. q.3-4h. p.r.n. pain.   DISCHARGE FOLLOWUP:  Six weeks  at [facility].     Renaldo Reel Emilee Hero, C.N.M.                   Janine Limbo, M.D.    VLL/MEDQ  D:  09/14/2002  T:  09/14/2002  Job:  [id number]

## 2011-02-16 NOTE — Op Note (Signed)
NAME:  Laura Simpson                         ACCOUNT NO.:  [id number]   MEDICAL RECORD NO.:  [id number]                   PATIENT TYPE:  INP   LOCATION:  9316                                 FACILITY:  [facility]   PHYSICIAN:  Janine Limbo, M.D.            DATE OF BIRTH:  1976-08-04   DATE OF PROCEDURE:  10/14/2003  DATE OF DISCHARGE:                                 OPERATIVE REPORT   PREOPERATIVE DIAGNOSIS:  Left ovarian dermoid cyst.   POSTOPERATIVE DIAGNOSES:  1. Left ovarian dermoid cyst.  2. Right ovarian surface mass.   PROCEDURES:  1. Exploratory laparotomy.  2. Left ovarian cystectomy.  3. Right ovarian biopsy.   SURGEON:  Janine Limbo, M.D.   FIRST ASSISTANT:  Hal Morales, M.D.   ANESTHESIA:  General.   DISPOSITION:  Laura Simpson is a 35 year old female who was found to have a 7  cm mass on her ovary that was consistent with a dermoid cyst.  She has had  abdominal pain.  She understands the indications for her procedure and she  accepts the risks of, but not limited to, anesthetic complications,  bleeding, infection, and possible damage to the surrounding organs.   FINDINGS:  There was a 7 cm dermoid cyst present.  The cyst had hair and oil  in its contents.  There was no evidence of nodularity.  The right ovary had  a 2-3 cm flat lesion that was white in color.  It was biopsied off the  ovary.  The fallopian tubes and the uterus appeared normal.  The portion of  the bowel that could be inspected through the small incision appeared  normal.   DESCRIPTION OF PROCEDURE:  The patient was taken to the operating room,  where a general anesthetic was given.  The patient's abdomen, perineum, and  vagina were prepped with multiple layers of Betadine.  A Foley catheter was  placed in the bladder.  The patient was sterilely draped.  The lower abdomen  was injected with 10 mL of 0.5% Marcaine with epinephrine.  A low transverse  incision was made and carried  sharply through the subcutaneous tissue, the  fascia, and the anterior peritoneum.  The pelvis was inspected with findings  as mentioned above.  The left ovary with the dermoid cyst was elevated into  the incision.  The base of the incision was covered using a moist pad.  An  incision was made in the capsule of the ovary and the dermoid cyst was  sharply and bluntly dissected from the ovarian tissue.  Hydrodissection was  also used.  The cyst did rupture as we dissected the cyst away, but none of  the contents spilled into the abdominal cavity.  The cyst was removed in its  entirety.  The ovary was vigorously irrigated.  Hemostasis was achieved  using the Bovie cautery.  The ovary was then closed using interrupted and  running  sutures of 3-0 Vicryl.  Hemostasis was noted to be adequate.  The  right ovary was then elevated into the field.  The flat white lesion was  then biopsied away.  The defect in the right ovary was closed using a  running suture of 3-0 Vicryl.  The pelvis was once again vigorously  irrigated.  Hemostasis was noted to be adequate.  At this point we felt we  were ready to terminate our procedure.  All instruments were removed.  The  anterior peritoneum and the abdominal musculature were reapproximated in the  midline.  The fascia and the subcutaneous tissue were irrigated.  Hemostasis  was adequate.  The fascia was closed using a running suture of 0 Vicryl  followed by three interrupted sutures of 0 Vicryl.  The subcutaneous layer  was closed using interrupted sutures of 0 Vicryl and 3-0 Vicryl.  The skin  was reapproximated using a subcuticular suture of 3-0 Vicryl.  Sponge,  needle, and instrument counts were correct on two occasions.  The estimated  blood loss was 25 mL.  The patient tolerated her procedure well.  She was  noted to drain 200 mL of clear yellow urine at the end of her procedure.  The patient was awakened from her anesthetic, and she was taken to the   recovery room in stable condition.                                               Janine Limbo, M.D.    AVS/MEDQ  D:  10/14/2003  T:  10/14/2003  Job:  [id number]   cc:   Otilio Connors. Gerri Spore, M.D.  [address]  [place]  [place] [address]  Fax: [phone]

## 2011-02-16 NOTE — H&P (Signed)
NAME:  BLAIR, MESINA                         ACCOUNT NO.:  [id number]   MEDICAL RECORD NO.:  [id number]                   PATIENT TYPE:  INP   LOCATION:  NA                                   [facility]   PHYSICIAN:  Janine Limbo, M.D.            DATE OF BIRTH:  [birth year]   DATE OF ADMISSION:  DATE OF DISCHARGE:                                HISTORY & PHYSICAL   DATE OF SURGERY:  October 14, 2003.   HISTORY OF PRESENT ILLNESS:  Ms. Yehuda Mao is a 35 year old female, para 3-0-1-  3, who presents for an exploratory laparotomy with a left ovarian cystectomy  because of a dermoid cyst.  The patient has had vague abdominal pain for  approximately six months.  An ultrasound was performed by her primary  physician which showed a 7.1 cm left dermoid cyst.  The patient is status  post tubal ligation.   OBSTETRICAL HISTORY:  1. In 1995, the patient had a miscarriage in the first trimester.  2. In 1997, the patient had a term vaginal delivery of a 7 pound 15 ounce     female infant.  3. In 2000, the patient had a term vaginal delivery of an 8 pound 6 ounce     female infant.  4. In 2003, the patient had a term vaginal delivery of a 7 pound 2 ounce     female infant.   DRUG ALLERGIES:  None known.   PAST MEDICAL HISTORY:  The patient suffers from depression, and she takes  Paxil CR 12.5 mg each day.   SOCIAL HISTORY:  The patient denies cigarette use, alcohol use, and  recreational drug use.   REVIEW OF SYSTEMS:  Please see history of present illness.   FAMILY HISTORY:  Noncontributory.   PHYSICAL EXAMINATION:  VITAL SIGNS:  Weight is 177 pounds.  HEENT:  Within normal limits.  CHEST:  Clear.  HEART:  Regular rate and rhythm.  BREASTS:  Without masses.  ABDOMEN:  Nontender.  EXTREMITIES:  Within normal limits.  NEUROLOGIC:  Grossly normal.  PELVIC:  External genitalia is normal.  The vagina is normal.  Cervix is  nontender.  The uterus is normal size, shape, and  consistency.  The fundus  is tender adnexa.  There is tenderness in the left, and there is a mass  present.   ASSESSMENT:  A 7 cm left dermoid cyst.   PLAN:  The patient will undergo an open laparotomy with a left dermoid cyst  excision.  She understands the indications for her procedure and she accepts  the risk of, but not limited to, anesthetic complications, bleeding,  infections, and possible damage to the surrounding organs.  Janine Limbo, M.D.    AVS/MEDQ  D:  10/13/2003  T:  10/13/2003  Job:  [id number]   cc:   Otilio Connors. Gerri Spore, M.D.  [address]  [place]  [place] [address]  Fax: [phone]

## 2011-02-16 NOTE — H&P (Signed)
NAME:  Laura Simpson, Laura Simpson                         ACCOUNT NO.:  [id number]   MEDICAL RECORD NO.:  [id number]                   PATIENT TYPE:  INP   LOCATION:  9145                                 [facility]ACILITY:  [facility]   PHYSICIAN:  Osborn Coho, M.D.                DATE OF BIRTH:  06-Dec-1975   DATE OF ADMISSION:  09/11/2002  DATE OF DISCHARGE:                                HISTORY & PHYSICAL   HISTORY OF PRESENT ILLNESS:  The patient is 35 year old gravida 4 para 2-0-1-  2 at 38-2/7 weeks who presents with uterine contractions every 3-5 minutes  for several hours.  She was seen in the office earlier today with cervix 2  cm, now with positive bloody show.  Pregnancy has been remarkable for: (1)  Desiring tubal sterilization with papers signed July 06, 2002, (2)  previous preterm labor with term delivery, (3) depression, (4) history of  hyperemesis, (5) history of irregular cycles.   PRENATAL LABORATORIES:  Blood type is O positive, Rh antibody negative, VDRL  nonreactive, rubella titer is equivocal, hepatitis B surface antigen is  negative, HIV is negative, toxoplasmosis titers are negative, cystic  fibrosis testing negative, Pap smear was normal, glucose challenge was  normal, AFP was normal, GC and Chlamydia cultures were negative, group B  strep culture was negative at 36 weeks.  EDC of September 20, 2002 was  established by ultrasound and last menstrual period.  Hemoglobin upon  entering the practice was 12.7; it was 12.1 at 29 weeks.   HISTORY OF PRESENT PREGNANCY:  The patient entered care at approximately 10  weeks.  She was on Zofran for nausea and vomiting.  She maintained that  through much of the pregnancy.  She had multiple musculoskeletal problems.  She elected to do a tubal and signed her papers on July 06, 2002.   PAST OBSTETRICAL HISTORY:  In 1995, she had a spontaneous miscarriage at 4-[redacted]  weeks gestation.  In 1997, she had a vaginal birth of a female infant weight  7  pounds 6 ounces at [redacted] weeks gestation, she was in labor 6-7 hours, she had  a vaginal delivery with Stadol anesthesia.  In August 2000, she had a  vaginal birth of a female infant weight 8 pounds 6 ounces at 39-1/[redacted] weeks  gestation, she was in labor 4 hours, she had epidural anesthesia.  She had  hyperemesis with her previous pregnancy.  Cervix was 50% effaced at 28 weeks  first pregnancy with bed rest noted.  Is a previous oral contraceptive user.   MEDICAL HISTORY:  She does have a cat but does not change the litter box.  She has occasional yeast infections.  She had UTI x1.  The patient also has  a history of depression, anxiety, and panic disorder.  She was placed on  Paxil 20 mg q.d. as far as depression.   HOSPITALIZATIONS:  Her only hospitalizations  are for childbirth x2.  She was  also in with dehydration in her second pregnancy.   FAMILY HISTORY:  Her maternal grandfather has hypertension.  Her paternal  grandfather is deceased.  Maternal grandmother has varicose veins.  Paternal  grandfather had emphysema.  Paternal grandfather insulin-dependent diabetes.  Paternal grandfather was on dialysis in past.  Maternal grandmother has had  strokes.  Maternal aunt has history of depression.   SOCIAL HISTORY:  The patient denies any alcohol, drug, or tobacco use during  this pregnancy.  She has been followed by the CNM service at [facility].  The patient is of the [facility] faith.  She is a Futures trader.   PHYSICAL EXAMINATION:  VITAL SIGNS:  Vitals are stable.  HEENT:  Within normal limits.  LUNGS:  Bilateral breath sounds are clear.  HEART:  Regular rate and rhythm without murmur.  BREASTS:  Soft and nontender.  ABDOMEN:  Fundal height is approximately 38 cm.  Estimated fetal weight is 6-  1/2 to 7 pounds.  Uterine contractions are every 7-10 minutes.  CERVIX:  Cervical exam 5 cm, 80% vertex, -2 station.  Fetal heart rate is  reactive with no decelerations.  Uterine contractions  are every 3-5 minutes,  moderate quality.   IMPRESSION:  1. Intrauterine pregnancy at 38-2/7 weeks.  2. Early labor.  3. Desires tubal sterilization.   PLAN:  1. Admit to the birth suite per consult with Dr. Osborn Coho as attending     physician.  2. Routine certified nurse midwife orders.  3. The patient desires tubal - will plan after delivery.  4. The patient desires epidural.     Chip Boer L. Emilee Hero, C.N.M.                   Osborn Coho, M.D.    VLL/MEDQ  D:  09/12/2002  T:  09/12/2002  Job:  [id number]

## 2011-05-23 ENCOUNTER — Emergency Department (HOSPITAL_BASED_OUTPATIENT_CLINIC_OR_DEPARTMENT_OTHER)
Admission: EM | Admit: 2011-05-23 | Discharge: 2011-05-23 | Disposition: A | Discharge status: Home / Self Care | Payer: 59 | Attending: Emergency Medicine | Admitting: Emergency Medicine

## 2011-05-23 ENCOUNTER — Emergency Department (INDEPENDENT_AMBULATORY_CARE_PROVIDER_SITE_OTHER): Payer: 59

## 2011-05-23 ENCOUNTER — Encounter: Payer: Self-pay | Admitting: Family Medicine

## 2011-05-23 ENCOUNTER — Other Ambulatory Visit: Payer: Self-pay

## 2011-05-23 DIAGNOSIS — R079 Chest pain, unspecified: Secondary | ICD-10-CM | POA: Insufficient documentation

## 2011-05-23 DIAGNOSIS — R0602 Shortness of breath: Secondary | ICD-10-CM

## 2011-05-23 HISTORY — DX: Tachycardia, unspecified: R00.0

## 2011-05-23 HISTORY — DX: Migraine, unspecified, not intractable, without status migrainosus: G43.909

## 2011-05-23 HISTORY — DX: Hypoglycemia, unspecified: E16.2

## 2011-05-23 LAB — BASIC METABOLIC PANEL
BUN: 11 mg/dL (ref 6–23)
Calcium: 9.9 mg/dL (ref 8.4–10.5)
Chloride: 104 mEq/L (ref 96–112)
Creatinine, Ser: 0.6 mg/dL (ref 0.50–1.10)
GFR calc Af Amer: >60 mL/min (ref >60)
GFR calc non Af Amer: >60 mL/min (ref >60)

## 2011-05-23 LAB — GLUCOSE, CAPILLARY
Glucose-Capillary: 104 mg/dL — ABNORMAL HIGH (ref 70–99)
Glucose-Capillary: 96 mg/dL (ref 70–99)

## 2011-05-23 LAB — URINALYSIS, ROUTINE W REFLEX MICROSCOPIC
Bilirubin Urine: NEGATIVE
Glucose, UA: NEGATIVE mg/dL
Ketones, ur: NEGATIVE mg/dL
Specific Gravity, Urine: 1.009 (ref 1.005–1.030)
pH: 6.5 (ref 5.0–8.0)

## 2011-05-23 LAB — D-DIMER, QUANTITATIVE: D-Dimer, Quant: 0.42 ug/mL-FEU (ref 0.00–0.48)

## 2011-05-23 LAB — CARDIAC PANEL(CRET KIN+CKTOT+MB+TROPI)
Relative Index: INVALID (ref 0.0–2.5)
Total CK: 73 U/L (ref 7–177)

## 2011-05-23 LAB — CBC
HCT: 41.5 % (ref 36.0–46.0)
MCH: 30.8 pg (ref 26.0–34.0)
MCHC: 34.5 g/dL (ref 30.0–36.0)
RDW: 13.1 % (ref 11.5–15.5)

## 2011-05-23 LAB — URINE MICROSCOPIC-ADD ON

## 2011-05-23 LAB — DIFFERENTIAL
Basophils Absolute: 0.1 10*3/uL (ref 0.0–0.1)
Basophils Relative: 1 % (ref 0–1)
Eosinophils Absolute: 0.8 10*3/uL — ABNORMAL HIGH (ref 0.0–0.7)
Monocytes Absolute: 0.6 10*3/uL (ref 0.1–1.0)
Monocytes Relative: 8 % (ref 3–12)
Neutro Abs: 3.6 10*3/uL (ref 1.7–7.7)

## 2011-05-23 MED ORDER — ONDANSETRON HCL 4 MG PO TABS: 4.0000 mg | ORAL_TABLET | Freq: Four times a day (QID) | ORAL | Status: AC

## 2011-05-23 MED ORDER — OXYCODONE-ACETAMINOPHEN 5-325 MG PO TABS: 2.0000 | ORAL_TABLET | ORAL | Status: AC | PRN

## 2011-05-23 MED ORDER — ONDANSETRON HCL 4 MG/2ML IJ SOLN
4.0000 mg | Freq: Once | INTRAMUSCULAR | Status: AC
  Administered 2011-05-23: 4 mg via INTRAVENOUS
  Filled 2011-05-23: qty 2

## 2011-05-23 MED ORDER — MORPHINE SULFATE 4 MG/ML IJ SOLN
4.0000 mg | Freq: Once | INTRAMUSCULAR | Status: AC
  Administered 2011-05-23: 4 mg via INTRAVENOUS
  Filled 2011-05-23: qty 1

## 2011-05-23 MED ORDER — SODIUM CHLORIDE 0.9 % IV BOLUS (SEPSIS)
1000.0000 mL | Freq: Once | INTRAVENOUS | Status: AC
  Administered 2011-05-23: 1000 mL via INTRAVENOUS

## 2011-05-23 MED ORDER — NAPROXEN 500 MG PO TABS: 500.0000 mg | ORAL_TABLET | Freq: Two times a day (BID) | ORAL | Status: AC

## 2011-05-23 MED ORDER — HYDROMORPHONE HCL 1 MG/ML IJ SOLN
1.0000 mg | Freq: Once | INTRAMUSCULAR | Status: AC
  Administered 2011-05-23: 1 mg via INTRAVENOUS
  Filled 2011-05-23: qty 1

## 2011-05-23 MED ORDER — SODIUM CHLORIDE 0.9 % IV BOLUS (SEPSIS): 1000.0000 mL | Freq: Once | INTRAVENOUS | Status: AC

## 2011-05-23 MED ORDER — FAMOTIDINE 20 MG PO TABS: 20.0000 mg | ORAL_TABLET | Freq: Two times a day (BID) | ORAL | Status: DC

## 2011-05-23 NOTE — ED Notes (Signed)
MD at bedside. 

## 2011-05-23 NOTE — ED Notes (Signed)
Pt c/o right sided cp that radiated to right jaw, right axillary region, right upper back. Pt started at 0300 while laying down. Pt reports "lightedness, nausea and some shortness of breath". Pt also c/o suprapubic pain and increased urine frequency that started about same time.

## 2011-05-23 NOTE — ED Provider Notes (Addendum)
History     CSN: [id number] Arrival date & time: 05/23/2011 10:31 AM  Chief Complaint  Patient presents with  . Chest Pain   HPI Comments: 35yoF ho migraines pw chest pain. Pt c/o one day of R sided chest pressure with radiation to right jaw/back/arm since this AM. Intermittent. States she is having shortness of breath when pain severe. Not worse with deep breathing or movement. +nausea when pain severe, no vomiting. Denies fever/chills/cough. States she has experienced similar in past fleetingly. This morning she also had transient suprapubic sharp pain which has since resolved. Denies upper abdominal pain. Denies lower back pain. Denies hematuria/dysuria/urgency. When she had the suprapubic pain she did try to urinate multiple times. Denies vaginal discharge. No H/O STI. States she has h/o tachycardia? And occ hypoglycemia. Denies fall/trauma. Denies h/o VTE in self or family. No recent hosp/surg/immob. No h/o cancer. Denies exogenous hormone use, no leg pain or swelling  Patient is a 35 y.o. female presenting with chest pain.  Chest Pain     Past Medical History  Diagnosis Date  . Hypoglycemia   . Tachycardia   . Migraines     Past Surgical History  Procedure Date  . Abdominal hysterectomy   . Tubal ligation     History reviewed. No pertinent family history.  History  Substance Use Topics  . Smoking status: Never Smoker   . Smokeless tobacco: Not on file  . Alcohol Use: No    OB History    Grav Para Term Preterm Abortions TAB SAB Ect Mult Living                  Review of Systems  Cardiovascular: Positive for chest pain.  All other systems reviewed and are negative.  except as noted HPI   Physical Exam  BP 102/60  Pulse 75  Temp(Src) 98.3 F (36.8 C) (Oral)  Resp 15  Ht 5\' 11"  (1.803 m)  Wt 215 lb (97.523 kg)  BMI 29.99 kg/m2  SpO2 99%  Physical Exam  Nursing note and vitals reviewed. Constitutional: She is oriented to person, place, and time. She  appears well-developed.       Appears to be in pain  HENT:  Head: Atraumatic.  Mouth/Throat: Oropharynx is clear and moist.  Eyes: Conjunctivae and EOM are normal. Pupils are equal, round, and reactive to light.  Neck: Normal range of motion. Neck supple.  Cardiovascular: Normal rate, regular rhythm, normal heart sounds and intact distal pulses.   Pulmonary/Chest: Effort normal and breath sounds normal. No respiratory distress. She has no wheezes. She has no rales. She exhibits no tenderness.  Abdominal: Soft. She exhibits no distension and no mass. There is no tenderness. There is no rebound and no guarding.       No epigastric or RUQ ttp  Musculoskeletal: Normal range of motion.  Neurological: She is alert and oriented to person, place, and time.  Skin: Skin is warm and dry. No rash noted.  Psychiatric: She has a normal mood and affect.   Labs Reviewed  GLUCOSE, CAPILLARY - Abnormal; Notable for the following:    Glucose-Capillary 104 (*)    All other components within normal limits  DIFFERENTIAL - Abnormal; Notable for the following:    Eosinophils Relative 12 (*)    Eosinophils Absolute 0.8 (*)    All other components within normal limits  BASIC METABOLIC PANEL - Abnormal; Notable for the following:    Glucose, Bld 100 (*)    All other  components within normal limits  URINALYSIS, ROUTINE W REFLEX MICROSCOPIC - Abnormal; Notable for the following:    Hgb urine dipstick TRACE (*)    All other components within normal limits  URINE MICROSCOPIC-ADD ON - Abnormal; Notable for the following:    Bacteria, UA FEW (*)    All other components within normal limits  CBC  CARDIAC PANEL(CRET KIN+CKTOT+MB+TROPI)  D-DIMER, QUANTITATIVE  GLUCOSE, CAPILLARY  TROPONIN I    Date: 05/23/2011  Rate: 75  Rhythm: normal sinus rhythm  QRS Axis: normal  Intervals: normal  ST/T Wave abnormalities: normal  Conduction Disutrbances:none  Narrative Interpretation:   Old EKG Reviewed: none  available   ED Course  Procedures  MDM 35yoF with atypical chest pain since this morning. Abd benign. She does not have rf for ischemic etiology. Also no RF for PE and perc negative. Possible GERD/esophageal spasm but would be atypical Nothing by history indicated infection and aortic dissection is highly unlikely. Labs reviewed and unremarkable including d- dimer. EKG unremarkable. Will recheck troponin. If negative home  3:20 PM  Pt states pain better at this time. Min nausea now.  3:32 PM  Repeat vitals BP as below. This is likely related to narcotic pain medication. IVF and reassess. Trop pending Filed Vitals:   05/23/11 1524  BP: 97/72  Pulse: 67  Temp:   Resp:    3:51 PM  Second troponin reviewed and negative. Will send home with percocet and naproxyn, pepcid. F/U PMD   Stefano Gaul, MD       Forbes Cellar, MD 05/23/11 1551  Forbes Cellar, MD 05/23/11 [phone]

## 2011-05-23 NOTE — ED Notes (Signed)
Pt denies chest pain

## 2011-05-23 NOTE — ED Notes (Signed)
Pt sts she took an Aspirin 325mg  at 0400 today.

## 2012-03-13 ENCOUNTER — Emergency Department (HOSPITAL_BASED_OUTPATIENT_CLINIC_OR_DEPARTMENT_OTHER)
Admission: EM | Admit: 2012-03-13 | Discharge: 2012-03-13 | Disposition: A | Discharge status: Home / Self Care | Payer: No Typology Code available for payment source | Attending: Emergency Medicine | Admitting: Emergency Medicine

## 2012-03-13 ENCOUNTER — Encounter (HOSPITAL_BASED_OUTPATIENT_CLINIC_OR_DEPARTMENT_OTHER): Payer: Self-pay | Admitting: *Deleted

## 2012-03-13 ENCOUNTER — Emergency Department (HOSPITAL_BASED_OUTPATIENT_CLINIC_OR_DEPARTMENT_OTHER): Payer: No Typology Code available for payment source

## 2012-03-13 DIAGNOSIS — K219 Gastro-esophageal reflux disease without esophagitis: Secondary | ICD-10-CM | POA: Insufficient documentation

## 2012-03-13 DIAGNOSIS — M545 Low back pain, unspecified: Secondary | ICD-10-CM | POA: Insufficient documentation

## 2012-03-13 DIAGNOSIS — Y92410 Unspecified street and highway as the place of occurrence of the external cause: Secondary | ICD-10-CM | POA: Insufficient documentation

## 2012-03-13 DIAGNOSIS — M79609 Pain in unspecified limb: Secondary | ICD-10-CM | POA: Insufficient documentation

## 2012-03-13 MED ORDER — NAPROXEN 250 MG PO TABS
500.0000 mg | ORAL_TABLET | Freq: Once | ORAL | Status: AC
  Administered 2012-03-13: 500 mg via ORAL
  Filled 2012-03-13: qty 2

## 2012-03-13 MED ORDER — HYDROCODONE-ACETAMINOPHEN 5-325 MG PO TABS: 1.0000 | ORAL_TABLET | Freq: Four times a day (QID) | ORAL | Status: AC | PRN

## 2012-03-13 MED ORDER — IBUPROFEN 800 MG PO TABS: 800.0000 mg | ORAL_TABLET | Freq: Three times a day (TID) | ORAL | Status: AC | PRN

## 2012-03-13 MED ORDER — ONDANSETRON 4 MG PO TBDP
4.0000 mg | ORAL_TABLET | Freq: Once | ORAL | Status: AC
  Administered 2012-03-13: 4 mg via ORAL
  Filled 2012-03-13: qty 1
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
## 2012-03-13 NOTE — ED Notes (Signed)
Patient transported to X-ray 

## 2013-04-19 ENCOUNTER — Emergency Department (HOSPITAL_BASED_OUTPATIENT_CLINIC_OR_DEPARTMENT_OTHER)
Admission: EM | Admit: 2013-04-19 | Discharge: 2013-04-19 | Disposition: A | Discharge status: Home / Self Care | Payer: 59 | Attending: Emergency Medicine | Admitting: Emergency Medicine

## 2013-04-19 ENCOUNTER — Encounter (HOSPITAL_BASED_OUTPATIENT_CLINIC_OR_DEPARTMENT_OTHER): Payer: Self-pay | Admitting: *Deleted

## 2013-04-19 DIAGNOSIS — Z8639 Personal history of other endocrine, nutritional and metabolic disease: Secondary | ICD-10-CM | POA: Insufficient documentation

## 2013-04-19 DIAGNOSIS — Z79899 Other long term (current) drug therapy: Secondary | ICD-10-CM | POA: Insufficient documentation

## 2013-04-19 DIAGNOSIS — K219 Gastro-esophageal reflux disease without esophagitis: Secondary | ICD-10-CM | POA: Insufficient documentation

## 2013-04-19 DIAGNOSIS — Z8679 Personal history of other diseases of the circulatory system: Secondary | ICD-10-CM | POA: Insufficient documentation

## 2013-04-19 DIAGNOSIS — M25519 Pain in unspecified shoulder: Secondary | ICD-10-CM | POA: Insufficient documentation

## 2013-04-19 DIAGNOSIS — M25511 Pain in right shoulder: Secondary | ICD-10-CM

## 2013-04-19 DIAGNOSIS — Z862 Personal history of diseases of the blood and blood-forming organs and certain disorders involving the immune mechanism: Secondary | ICD-10-CM | POA: Insufficient documentation

## 2013-04-19 DIAGNOSIS — R52 Pain, unspecified: Secondary | ICD-10-CM | POA: Insufficient documentation

## 2013-04-19 DIAGNOSIS — M79609 Pain in unspecified limb: Secondary | ICD-10-CM | POA: Insufficient documentation

## 2013-04-19 MED ORDER — ONDANSETRON 8 MG PO TBDP
8.0000 mg | ORAL_TABLET | Freq: Once | ORAL | Status: AC
  Administered 2013-04-19: 8 mg via ORAL
  Filled 2013-04-19: qty 1

## 2013-04-19 MED ORDER — PREDNISONE 20 MG PO TABS: ORAL_TABLET | ORAL | Status: DC

## 2013-04-19 MED ORDER — PREDNISONE 10 MG PO TABS
60.0000 mg | ORAL_TABLET | Freq: Once | ORAL | Status: AC
  Administered 2013-04-19: 60 mg via ORAL
  Filled 2013-04-19: qty 1

## 2013-04-19 MED ORDER — HYDROMORPHONE HCL PF 2 MG/ML IJ SOLN
2.0000 mg | Freq: Once | INTRAMUSCULAR | Status: AC
  Administered 2013-04-19: 2 mg via INTRAMUSCULAR
  Filled 2013-04-19: qty 1

## 2013-04-19 NOTE — ED Provider Notes (Signed)
History    CSN: [id number] Arrival date & time 04/19/13  1122  First MD Initiated Contact with Patient 04/19/13 1159     Chief Complaint  Patient presents with  . Shoulder Pain   (Consider location/radiation/quality/duration/timing/severity/associated sxs/prior Treatment) HPI Comments: Patient presents with complaint of right shoulder and arm pain. Patient has a history of disc problems in her neck and rotator cuff injury. She is currently seeing an orthopedist physician in [facility] and is undergoing physical therapy. For the past 3 days she has had worsening pain in her right shoulder and arm that is described as a burning pain. She denies increased weakness, numbness or tingling in her hand or wrist. She's been taking Percocet and soma without relief. Today the pain was worse so she came in for evaluation. She denies lower extremity pain or weakness. No fevers. Onset of symptoms gradual. Course is constant. Nothing makes symptoms better. Movement of her arm the pain worse.  The history is provided by the patient.   Past Medical History  Diagnosis Date  . Hypoglycemia   . Tachycardia   . Migraines   . GERD (gastroesophageal reflux disease)    Past Surgical History  Procedure Laterality Date  . Abdominal hysterectomy    . Tubal ligation     History reviewed. No pertinent family history. History  Substance Use Topics  . Smoking status: Never Smoker   . Smokeless tobacco: Not on file  . Alcohol Use: No   OB History   Grav Para Term Preterm Abortions TAB SAB Ect Mult Living                 Review of Systems  Constitutional: Negative for activity change.  HENT: Negative for neck pain.   Cardiovascular: Negative for leg swelling.  Musculoskeletal: Positive for myalgias and arthralgias. Negative for back pain and joint swelling.  Skin: Negative for wound.  Neurological: Negative for weakness and numbness.    Allergies  Review of patient's allergies indicates no known  allergies.  Home Medications   Current Outpatient Rx  Name  Route  Sig  Dispense  Refill  . carisoprodol (SOMA) 350 MG tablet   Oral   Take 350 mg by mouth 4 (four) times daily as needed for muscle spasms.         Marland Kitchen oxyCODONE-acetaminophen (PERCOCET/ROXICET) 5-325 MG per tablet   Oral   Take 1 tablet by mouth every 4 (four) hours as needed for pain.         Marland Kitchen EXPIRED: famotidine (PEPCID) 20 MG tablet   Oral   Take 1 tablet (20 mg total) by mouth 2 (two) times daily.   30 tablet   0   . omeprazole (PRILOSEC) 20 MG capsule   Oral   Take 20 mg by mouth daily.          BP 120/74  Pulse 80  Temp(Src) 98.2 F (36.8 C) (Oral)  Resp 22  Ht 5\' 11"  (1.803 m)  Wt 240 lb (108.863 kg)  BMI 33.49 kg/m2  SpO2 100% Physical Exam  Nursing note and vitals reviewed. Constitutional: She appears well-developed and well-nourished.  HENT:  Head: Normocephalic and atraumatic.  Eyes: Pupils are equal, round, and reactive to light.  Neck: Normal range of motion. Neck supple.  Cardiovascular: Exam reveals no decreased pulses.   Pulses:      Radial pulses are 2+ on the right side, and 2+ on the left side.  Musculoskeletal: She exhibits tenderness. She exhibits  no edema.       Right shoulder: She exhibits tenderness. She exhibits no bony tenderness.       Right elbow: Normal.      Right wrist: Normal.       Cervical back: Normal. She exhibits normal range of motion, no tenderness and no bony tenderness.       Right upper arm: She exhibits tenderness. She exhibits no bony tenderness, no swelling and no edema.       Right forearm: Normal.       Right hand: Normal.  Neurological: She is alert. No sensory deficit.  Motor, sensation, and vascular distal to the injury is fully intact.   Skin: Skin is warm and dry.  Psychiatric: She has a normal mood and affect.    ED Course  Procedures (including critical care time) Labs Reviewed - No data to display No results found. No diagnosis  found.  12:28 PM Patient seen and examined. Medications ordered.   Vital signs reviewed and are as follows: Filed Vitals:   04/19/13 1140  BP: 120/74  Pulse: 80  Temp: 98.2 F (36.8 C)  Resp: 22   Pain medication and steroids given in emergency department. I've encouraged patient to followup with her orthopedist as soon as she can for further evaluation.  Patient urged to return with worsening symptoms or other concerns. Patient verbalized understanding and agrees with plan.   MDM  Patient with right shoulder and upper arm pain. There is suspicion for a radicular component. Will give trial of steroids. Patient has no contraindications. Upper extremity is neurovascularly intact. There is no signs of lower extremity weakness to suggest central cord injury or involvement. Patient has followup.  Renne Crigler, PA-C 04/19/13 1241

## 2013-04-19 NOTE — ED Notes (Signed)
Pt reports having a torn rotator cuff.  Had a steroid shot in June.  Pt has an appointment to f/u in August.  Reports that Thursday she woke up with increased pain. Pain has increased since that time. Pt has taken pain medicine without relief-percocet and soma at 1230am today.

## 2013-04-20 NOTE — ED Provider Notes (Signed)
Medical screening examination/treatment/procedure(s) were performed by non-physician practitioner and as supervising physician I was immediately available for consultation/collaboration.   Shelda Jakes, MD 04/20/13 [phone]

## 2014-02-21 ENCOUNTER — Encounter (HOSPITAL_BASED_OUTPATIENT_CLINIC_OR_DEPARTMENT_OTHER): Payer: Self-pay | Admitting: Emergency Medicine

## 2014-02-21 ENCOUNTER — Emergency Department (HOSPITAL_BASED_OUTPATIENT_CLINIC_OR_DEPARTMENT_OTHER)
Admission: EM | Admit: 2014-02-21 | Discharge: 2014-02-21 | Disposition: A | Discharge status: Home / Self Care | Payer: 59 | Attending: Emergency Medicine | Admitting: Emergency Medicine

## 2014-02-21 DIAGNOSIS — M5416 Radiculopathy, lumbar region: Secondary | ICD-10-CM

## 2014-02-21 DIAGNOSIS — Z8679 Personal history of other diseases of the circulatory system: Secondary | ICD-10-CM | POA: Insufficient documentation

## 2014-02-21 DIAGNOSIS — IMO0002 Reserved for concepts with insufficient information to code with codable children: Secondary | ICD-10-CM | POA: Insufficient documentation

## 2014-02-21 DIAGNOSIS — K219 Gastro-esophageal reflux disease without esophagitis: Secondary | ICD-10-CM | POA: Insufficient documentation

## 2014-02-21 DIAGNOSIS — Z79899 Other long term (current) drug therapy: Secondary | ICD-10-CM | POA: Insufficient documentation

## 2014-02-21 MED ORDER — ONDANSETRON HCL 8 MG PO TABS: 8.0000 mg | ORAL_TABLET | Freq: Three times a day (TID) | ORAL | Status: DC | PRN

## 2014-02-21 MED ORDER — OXYCODONE-ACETAMINOPHEN 5-325 MG PO TABS: 1.0000 | ORAL_TABLET | Freq: Four times a day (QID) | ORAL | Status: DC | PRN

## 2014-02-21 MED ORDER — ONDANSETRON 8 MG PO TBDP
8.0000 mg | ORAL_TABLET | Freq: Once | ORAL | Status: AC
  Administered 2014-02-21: 8 mg via ORAL
  Filled 2014-02-21: qty 1

## 2014-02-21 MED ORDER — OXYCODONE-ACETAMINOPHEN 5-325 MG PO TABS
2.0000 | ORAL_TABLET | Freq: Once | ORAL | Status: AC
  Administered 2014-02-21: 2 via ORAL
  Filled 2014-02-21: qty 2

## 2014-02-21 NOTE — Discharge Instructions (Signed)
Back Pain, Adult Take Tylenol or Motrin for mild pain or the pain medicine prescribed for bad pain. See your primary care physician if you have significant pain in a week Back pain is very common. The pain often gets better over time. The cause of back pain is usually not dangerous. Most people can learn to manage their back pain on their own.  HOME CARE   Stay active. Start with short walks on flat ground if you can. Try to walk farther each day.  Do not sit, drive, or stand in one place for more than 30 minutes. Do not stay in bed.  Do not avoid exercise or work. Activity can help your back heal faster.  Be careful when you bend or lift an object. Bend at your knees, keep the object close to you, and do not twist.  Sleep on a firm mattress. Lie on your side, and bend your knees. If you lie on your back, put a pillow under your knees.  Only take medicines as told by your doctor.  Put ice on the injured area.  Put ice in a plastic bag.  Place a towel between your skin and the bag.  Leave the ice on for 15-20 minutes, 03-04 times a day for the first 2 to 3 days. After that, you can switch between ice and heat packs.  Ask your doctor about back exercises or massage.  Avoid feeling anxious or stressed. Find good ways to deal with stress, such as exercise. GET HELP RIGHT AWAY IF:   Your pain does not go away with rest or medicine.  Your pain does not go away in 1 week.  You have new problems.  You do not feel well.  The pain spreads into your legs.  You cannot control when you poop (bowel movement) or pee (urinate).  Your arms or legs feel weak or lose feeling (numbness).  You feel sick to your stomach (nauseous) or throw up (vomit).  You have belly (abdominal) pain.  You feel like you may pass out (faint). MAKE SURE YOU:   Understand these instructions.  Will watch your condition.  Will get help right away if you are not doing well or get worse. Document Released:  03/05/2008 Document Revised: 12/10/2011 Document Reviewed: 02/05/2011 [facility] Patient Information 2014 [place], [place].

## 2014-02-21 NOTE — ED Provider Notes (Signed)
CSN: [id number]     Arrival date & time 02/21/14  0907 History   First MD Initiated Contact with Patient 02/21/14 1008     Chief Complaint  Patient presents with  . Back Pain     (Consider location/radiation/quality/duration/timing/severity/associated sxs/prior Treatment) HPI  complains of left sided paralumbar pain radiating to left knee onset 2 days ago. Pain is worse with movement improved with remaining still. No incontinence no fever no trauma no other associated symptoms. Treated herself with ibuprofen, without relief. Past Medical History  Diagnosis Date  . Hypoglycemia   . Tachycardia   . Migraines   . GERD (gastroesophageal reflux disease)    Past Surgical History  Procedure Laterality Date  . Abdominal hysterectomy    . Tubal ligation     No family history on file. History  Substance Use Topics  . Smoking status: Never Smoker   . Smokeless tobacco: Not on file  . Alcohol Use: No   OB History   Grav Para Term Preterm Abortions TAB SAB Ect Mult Living                 Review of Systems  Constitutional: Negative.   Respiratory: Negative.   Gastrointestinal: Negative.   Genitourinary: Negative.   Musculoskeletal: Positive for back pain.  Neurological: Negative.       Allergies  Review of patient's allergies indicates no known allergies.  Home Medications   Prior to Admission medications   Medication Sig Start Date End Date Taking? Authorizing Provider  carisoprodol (SOMA) 350 MG tablet Take 350 mg by mouth 4 (four) times daily as needed for muscle spasms.    Historical Provider, MD  famotidine (PEPCID) 20 MG tablet Take 1 tablet (20 mg total) by mouth 2 (two) times daily. 05/23/11 05/22/12  Forbes CellarLeigh-Ann Webb, MD  omeprazole (PRILOSEC) 20 MG capsule Take 20 mg by mouth daily.    Historical Provider, MD  oxyCODONE-acetaminophen (PERCOCET/ROXICET) 5-325 MG per tablet Take 1 tablet by mouth every 4 (four) hours as needed for pain.    Historical Provider, MD   predniSONE (DELTASONE) 20 MG tablet 3 Tabs PO Days 1-3, then 2 tabs PO Days 4-6, then 1 tab PO Day 7-9, then Half Tab PO Day 10-12 04/19/13   Renne CriglerJoshua Geiple, PA-C   BP 110/71  Pulse 101  Temp(Src) 98.2 F (36.8 C) (Oral)  Resp 20  Ht 5\' 11"  (1.803 m)  Wt 258 lb (117.028 kg)  BMI 36.00 kg/m2  SpO2 100% Physical Exam  Nursing note and vitals reviewed. Constitutional: She appears well-developed and well-nourished. She appears distressed.  Appears uncomfortable Glasgow Coma Score 15  HENT:  Head: Normocephalic and atraumatic.  Eyes: Conjunctivae are normal. Pupils are equal, round, and reactive to light.  Neck: Neck supple. No tracheal deviation present. No thyromegaly present.  Cardiovascular: Normal rate and regular rhythm.   No murmur heard. Pulmonary/Chest: Effort normal and breath sounds normal.  Abdominal: Soft. Bowel sounds are normal. She exhibits no distension. There is no tenderness.  Musculoskeletal: Normal range of motion. She exhibits no edema and no tenderness.  Entire spine nontender. Left sided paralumbar tenderness. Pain is exacerbated by her changing position  Neurological: She is alert. She has normal reflexes. Coordination normal.  DTRs symmetric bilaterally at knee jerk ankle jerk and biceps toes are going bilaterally walks unassisted  Skin: Skin is warm and dry. No rash noted.  Psychiatric: She has a normal mood and affect.    ED Course  Procedures (including critical care  time) Labs Review Labs Reviewed - No data to display  Imaging Review No results found.   EKG Interpretation None      MDM  Emergent imaging not indicated  And prescription Percocet, Zofran. Patient requests antiemetic when taking pain medicine and followup PMD if significant pain in a week  Final diagnoses:  None   diagnosis lumbar radiculopathy      Doug Sou, MD 02/21/14 1028

## 2014-02-21 NOTE — ED Notes (Signed)
Patient c/o lower back pain/spasms since Friday night, took ibuprofen but no relief

## 2014-06-21 ENCOUNTER — Emergency Department (HOSPITAL_BASED_OUTPATIENT_CLINIC_OR_DEPARTMENT_OTHER): Payer: 59

## 2014-06-21 ENCOUNTER — Encounter (HOSPITAL_BASED_OUTPATIENT_CLINIC_OR_DEPARTMENT_OTHER): Payer: Self-pay | Admitting: Emergency Medicine

## 2014-06-21 ENCOUNTER — Encounter (HOSPITAL_BASED_OUTPATIENT_CLINIC_OR_DEPARTMENT_OTHER)
Admission: EM | Disposition: A | Discharge status: Home / Self Care | Payer: Self-pay | Source: Home / Self Care | Attending: Emergency Medicine

## 2014-06-21 ENCOUNTER — Emergency Department (HOSPITAL_BASED_OUTPATIENT_CLINIC_OR_DEPARTMENT_OTHER)
Admission: EM | Admit: 2014-06-21 | Discharge: 2014-06-21 | Disposition: A | Discharge status: Home / Self Care | Payer: 59 | Attending: Emergency Medicine | Admitting: Emergency Medicine

## 2014-06-21 DIAGNOSIS — Z79899 Other long term (current) drug therapy: Secondary | ICD-10-CM | POA: Diagnosis not present

## 2014-06-21 DIAGNOSIS — IMO0002 Reserved for concepts with insufficient information to code with codable children: Secondary | ICD-10-CM | POA: Diagnosis not present

## 2014-06-21 DIAGNOSIS — K219 Gastro-esophageal reflux disease without esophagitis: Secondary | ICD-10-CM | POA: Insufficient documentation

## 2014-06-21 DIAGNOSIS — R51 Headache: Secondary | ICD-10-CM | POA: Insufficient documentation

## 2014-06-21 DIAGNOSIS — Z8679 Personal history of other diseases of the circulatory system: Secondary | ICD-10-CM | POA: Insufficient documentation

## 2014-06-21 DIAGNOSIS — R519 Headache, unspecified: Secondary | ICD-10-CM

## 2014-06-21 SURGERY — TOTAL HIP REVISION
Anesthesia: Choice | Laterality: Left

## 2014-06-21 MED ORDER — SODIUM CHLORIDE 0.9 % IV SOLN
1000.0000 mL | Freq: Once | INTRAVENOUS | Status: AC
  Administered 2014-06-21: 1000 mL via INTRAVENOUS

## 2014-06-21 MED ORDER — DEXAMETHASONE SODIUM PHOSPHATE 10 MG/ML IJ SOLN: 12.0000 mg | Freq: Once | INTRAMUSCULAR | Status: DC

## 2014-06-21 MED ORDER — SODIUM CHLORIDE 0.9 % IV SOLN: 1000.0000 mL | INTRAVENOUS | Status: DC

## 2014-06-21 MED ORDER — DEXAMETHASONE SODIUM PHOSPHATE 4 MG/ML IJ SOLN
INTRAMUSCULAR | Status: AC
  Administered 2014-06-21: 12 mg
  Filled 2014-06-21: qty 3

## 2014-06-21 MED ORDER — DIPHENHYDRAMINE HCL 50 MG/ML IJ SOLN
25.0000 mg | Freq: Once | INTRAMUSCULAR | Status: AC
  Administered 2014-06-21: 25 mg via INTRAVENOUS
  Filled 2014-06-21: qty 1

## 2014-06-21 MED ORDER — KETOROLAC TROMETHAMINE 30 MG/ML IJ SOLN
30.0000 mg | Freq: Once | INTRAMUSCULAR | Status: AC
  Administered 2014-06-21: 30 mg via INTRAVENOUS
  Filled 2014-06-21: qty 1

## 2014-06-21 MED ORDER — METOCLOPRAMIDE HCL 5 MG/ML IJ SOLN
10.0000 mg | Freq: Once | INTRAMUSCULAR | Status: AC
  Administered 2014-06-21: 10 mg via INTRAVENOUS
  Filled 2014-06-21: qty 2

## 2014-06-21 NOTE — ED Notes (Signed)
Pt report headache over the last three weeks awoke this morning by sharp pain left side of head no neuro deficits noted

## 2014-06-21 NOTE — Discharge Instructions (Signed)

## 2014-06-21 NOTE — ED Provider Notes (Signed)
CSN: [id number]     Arrival date & time 06/21/14  4098 History   First MD Initiated Contact with Patient 06/21/14 0541     Chief Complaint  Patient presents with  . Headache     (Consider location/radiation/quality/duration/timing/severity/associated sxs/prior Treatment) Patient is a 38 y.o. female presenting with headaches. The history is provided by the patient.  Headache She has been having a left-sided headache for the last 3 weeks. Headache is still and usually gets worse as the day goes on. Usually goes to sleep at night but it is used to gone when she wakes up. She has a history of headaches but is unusual for headaches persist like this. She will cut this morning with a severe, sharp, stabbing pain in the left temporal area. This pain will be present for a few seconds and then subsided only to recur. She rates pain at 8/10 one present and subsides to almost nothing. There is no associated visual change, nausea, vomiting, and weakness, dizziness, incoordination. She does have a history of migraine headaches and states that 2 of headaches she said in the past weeks have been migraines. The headache she is having this morning is different from any of those. She has not taken anything for pain.  Past Medical History  Diagnosis Date  . Hypoglycemia   . Tachycardia   . Migraines   . GERD (gastroesophageal reflux disease)    Past Surgical History  Procedure Laterality Date  . Abdominal hysterectomy    . Tubal ligation     History reviewed. No pertinent family history. History  Substance Use Topics  . Smoking status: Never Smoker   . Smokeless tobacco: Not on file  . Alcohol Use: No   OB History   Grav Para Term Preterm Abortions TAB SAB Ect Mult Living                 Review of Systems  Neurological: Positive for headaches.  All other systems reviewed and are negative.     Allergies  Review of patient's allergies indicates no known allergies.  Home Medications    Prior to Admission medications   Medication Sig Start Date End Date Taking? Authorizing Provider  carisoprodol (SOMA) 350 MG tablet Take 350 mg by mouth 4 (four) times daily as needed for muscle spasms.    Historical Provider, MD  famotidine (PEPCID) 20 MG tablet Take 1 tablet (20 mg total) by mouth 2 (two) times daily. 05/23/11 05/22/12  Forbes Cellar, MD  omeprazole (PRILOSEC) 20 MG capsule Take 20 mg by mouth daily.    Historical Provider, MD  ondansetron (ZOFRAN) 8 MG tablet Take 1 tablet (8 mg total) by mouth every 8 (eight) hours as needed for nausea. 02/21/14   Doug Sou, MD  oxyCODONE-acetaminophen (PERCOCET) 5-325 MG per tablet Take 1-2 tablets by mouth every 6 (six) hours as needed for severe pain. 02/21/14   Doug Sou, MD  oxyCODONE-acetaminophen (PERCOCET/ROXICET) 5-325 MG per tablet Take 1 tablet by mouth every 4 (four) hours as needed for pain.    Historical Provider, MD  predniSONE (DELTASONE) 20 MG tablet 3 Tabs PO Days 1-3, then 2 tabs PO Days 4-6, then 1 tab PO Day 7-9, then Half Tab PO Day 10-12 04/19/13   Renne Crigler, PA-C   BP 109/73  Pulse 79  Temp(Src) 98.3 F (36.8 C)  Resp 18  Ht  (1.803 m)  Wt 235 lb (106.595 kg)  BMI 32.79 kg/m2  SpO2 100% Physical Exam  Nursing  note and vitals reviewed.  38 year old female, resting comfortably and in no acute distress. Vital signs are normal. Oxygen saturation is 100%, which is normal. Head is normocephalic and atraumatic. PERRLA, EOMI. Oropharynx is clear. Fundi show no hemorrhage, exudate, or papilledema. There is no tenderness over her sinuses. There is no tenderness over the temporalis muscle or the paracervical muscles. Neck is nontender and supple without adenopathy or JVD. Back is nontender and there is no CVA tenderness. Lungs are clear without rales, wheezes, or rhonchi. Chest is nontender. Heart has regular rate and rhythm without murmur. Abdomen is soft, flat, nontender without masses or  hepatosplenomegaly and peristalsis is normoactive. Extremities have no cyanosis or edema, full range of motion is present. Skin is warm and dry without rash. Neurologic: Mental status is normal, cranial nerves are intact, there are no motor or sensory deficits.  ED Course  Procedures (including critical care time)  Imaging Review Ct Head Wo Contrast  06/21/2014   CLINICAL DATA:  Headache for several days on the left. No known injury.  EXAM: CT HEAD WITHOUT CONTRAST  TECHNIQUE: Contiguous axial images were obtained from the base of the skull through the vertex without intravenous contrast.  COMPARISON:  Brain MRI 03/30/2012  FINDINGS: Skull and Sinuses:No acute fracture or destructive process.  There is a small effusion layering within the right maxillary sinus. No associated mucosal thickening. Patchy mucosal thickening in the left anterior ethmoid air cells. Chronic asymmetric aeration of the petrous apices, better aerated on the right.  Orbits: No acute abnormality.  Brain: No evidence of acute abnormality, such as acute infarction, hemorrhage, hydrocephalus, or mass lesion/mass effect.  IMPRESSION: 1. No acute intracranial disease. 2. Left ethmoid mucosal thickening and right maxillary effusion. Correlate for clinical signs of acute sinusitis.   Electronically Signed   By: Tiburcio Pea M.D.   On: 06/21/2014 06:14    MDM   Final diagnoses:  Headache, unspecified headache type    Headache of uncertain cause. Pattern her recent headache is suggestive of muscle contraction headache. Headache that she is having this morning is atypical for migraine and for muscle contraction headache. She will be sent for CT scan since this is a significant change from her prior headache pattern and she'll be given a therapeutic trial of a headache cocktail. Old records reviewed and I do not see any prior ED visits for headaches.  CT is unremarkable. Since getting the headache cocktail, she is not had any more  of the sharp pains. She's given a dose of dexamethasone and is discharged.  Dione Booze, MD 06/21/14 [phone]

## 2015-11-06 ENCOUNTER — Emergency Department (HOSPITAL_BASED_OUTPATIENT_CLINIC_OR_DEPARTMENT_OTHER)
Admission: EM | Admit: 2015-11-06 | Discharge: 2015-11-06 | Disposition: A | Discharge status: Home / Self Care | Payer: 59 | Attending: Emergency Medicine | Admitting: Emergency Medicine

## 2015-11-06 ENCOUNTER — Emergency Department (HOSPITAL_BASED_OUTPATIENT_CLINIC_OR_DEPARTMENT_OTHER): Payer: 59

## 2015-11-06 ENCOUNTER — Encounter (HOSPITAL_BASED_OUTPATIENT_CLINIC_OR_DEPARTMENT_OTHER): Payer: Self-pay | Admitting: *Deleted

## 2015-11-06 DIAGNOSIS — K219 Gastro-esophageal reflux disease without esophagitis: Secondary | ICD-10-CM | POA: Insufficient documentation

## 2015-11-06 DIAGNOSIS — Z8639 Personal history of other endocrine, nutritional and metabolic disease: Secondary | ICD-10-CM | POA: Insufficient documentation

## 2015-11-06 DIAGNOSIS — R42 Dizziness and giddiness: Secondary | ICD-10-CM | POA: Insufficient documentation

## 2015-11-06 DIAGNOSIS — G43909 Migraine, unspecified, not intractable, without status migrainosus: Secondary | ICD-10-CM | POA: Diagnosis not present

## 2015-11-06 DIAGNOSIS — Z79899 Other long term (current) drug therapy: Secondary | ICD-10-CM | POA: Insufficient documentation

## 2015-11-06 DIAGNOSIS — R079 Chest pain, unspecified: Secondary | ICD-10-CM | POA: Diagnosis not present

## 2015-11-06 DIAGNOSIS — R5383 Other fatigue: Secondary | ICD-10-CM | POA: Diagnosis not present

## 2015-11-06 LAB — TROPONIN I: Troponin I: <0.03 ng/mL (ref <0.031)

## 2015-11-06 NOTE — ED Notes (Signed)
C/o mid chest pain, radiates up to R jaw, also nausea (denies: sob, dizziness, vd, fever), VSS, family at [facility]. Alert, NAD, calm, skin W&D, resps e/u.

## 2015-11-06 NOTE — ED Notes (Signed)
Pt reports chest burning, jaw pain to bilateral jaw, radiation into right arm.  Reports nausea, denies vomiting.  Denies SOB.

## 2015-11-06 NOTE — Discharge Instructions (Signed)
Nonspecific Chest Pain  °Chest pain can be caused by many different conditions. There is always a chance that your pain could be related to something serious, such as a heart attack or a blood clot in your lungs. Chest pain can also be caused by conditions that are not life-threatening. If you have chest pain, it is very important to follow up with your health care provider. °CAUSES  °Chest pain can be caused by: °· Heartburn. °· Pneumonia or bronchitis. °· Anxiety or stress. °· Inflammation around your heart (pericarditis) or lung (pleuritis or pleurisy). °· A blood clot in your lung. °· A collapsed lung (pneumothorax). It can develop suddenly on its own (spontaneous pneumothorax) or from trauma to the chest. °· Shingles infection (varicella-zoster virus). °· Heart attack. °· Damage to the bones, muscles, and cartilage that make up your chest wall. This can include: °¨ Bruised bones due to injury. °¨ Strained muscles or cartilage due to frequent or repeated coughing or overwork. °¨ Fracture to one or more ribs. °¨ Sore cartilage due to inflammation (costochondritis). °RISK FACTORS  °Risk factors for chest pain may include: °· Activities that increase your risk for trauma or injury to your chest. °· Respiratory infections or conditions that cause frequent coughing. °· Medical conditions or overeating that can cause heartburn. °· Heart disease or family history of heart disease. °· Conditions or health behaviors that increase your risk of developing a blood clot. °· Having had chicken pox (varicella zoster). °SIGNS AND SYMPTOMS °Chest pain can feel like: °· Burning or tingling on the surface of your chest or deep in your chest. °· Crushing, pressure, aching, or squeezing pain. °· Dull or sharp pain that is worse when you move, cough, or take a deep breath. °· Pain that is also felt in your back, neck, shoulder, or arm, or pain that spreads to any of these areas. °Your chest pain may come and go, or it may stay  constant. °DIAGNOSIS °Lab tests or other studies may be needed to find the cause of your pain. Your health care provider may have you take a test called an ambulatory ECG (electrocardiogram). An ECG records your heartbeat patterns at the time the test is performed. You may also have other tests, such as: °· Transthoracic echocardiogram (TTE). During echocardiography, sound waves are used to create a picture of all of the heart structures and to look at how blood flows through your heart. °· Transesophageal echocardiogram (TEE). This is a more advanced imaging test that obtains images from inside your body. It allows your health care provider to see your heart in finer detail. °· Cardiac monitoring. This allows your health care provider to monitor your heart rate and rhythm in real time. °· Holter monitor. This is a portable device that records your heartbeat and can help to diagnose abnormal heartbeats. It allows your health care provider to track your heart activity for several days, if needed. °· Stress tests. These can be done through exercise or by taking medicine that makes your heart beat more quickly. °· Blood tests. °· Imaging tests. °TREATMENT  °Your treatment depends on what is causing your chest pain. Treatment may include: °· Medicines. These may include: °¨ Acid blockers for heartburn. °¨ Anti-inflammatory medicine. °¨ Pain medicine for inflammatory conditions. °¨ Antibiotic medicine, if an infection is present. °¨ Medicines to dissolve blood clots. °¨ Medicines to treat coronary artery disease. °· Supportive care for conditions that do not require medicines. This may include: °¨ Resting. °¨ Applying heat   or cold packs to injured areas. °¨ Limiting activities until pain decreases. °HOME CARE INSTRUCTIONS °· If you were prescribed an antibiotic medicine, finish it all even if you start to feel better. °· Avoid any activities that bring on chest pain. °· Do not use any tobacco products, including  cigarettes, chewing tobacco, or electronic cigarettes. If you need help quitting, ask your health care provider. °· Do not drink alcohol. °· Take medicines only as directed by your health care provider. °· Keep all follow-up visits as directed by your health care provider. This is important. This includes any further testing if your chest pain does not go away. °· If heartburn is the cause for your chest pain, you may be told to keep your head raised (elevated) while sleeping. This reduces the chance that acid will go from your stomach into your esophagus. °· Make lifestyle changes as directed by your health care provider. These may include: °¨ Getting regular exercise. Ask your health care provider to suggest some activities that are safe for you. °¨ Eating a heart-healthy diet. A registered dietitian can help you to learn healthy eating options. °¨ Maintaining a healthy weight. °¨ Managing diabetes, if necessary. °¨ Reducing stress. °SEEK MEDICAL CARE IF: °· Your chest pain does not go away after treatment. °· You have a rash with blisters on your chest. °· You have a fever. °SEEK IMMEDIATE MEDICAL CARE IF:  °· Your chest pain is worse. °· You have an increasing cough, or you cough up blood. °· You have severe abdominal pain. °· You have severe weakness. °· You faint. °· You have chills. °· You have sudden, unexplained chest discomfort. °· You have sudden, unexplained discomfort in your arms, back, neck, or jaw. °· You have shortness of breath at any time. °· You suddenly start to sweat, or your skin gets clammy. °· You feel nauseous or you vomit. °· You suddenly feel light-headed or dizzy. °· Your heart begins to beat quickly, or it feels like it is skipping beats. °These symptoms may represent a serious problem that is an emergency. Do not wait to see if the symptoms will go away. Get medical help right away. Call your local emergency services (911 in the [place] Do not drive yourself to the hospital. °  °This  information is not intended to replace advice given to you by your health care provider. Make sure you discuss any questions you have with your health care provider. °  °Document Released: 06/27/2005 Document Revised: 10/08/2014 Document Reviewed: 04/23/2014 °Elsevier Interactive Patient Education ©2016 [employer] ° °

## 2015-11-06 NOTE — ED Provider Notes (Signed)
CSN: [id number]     Arrival date & time 11/06/15  2016 History  By signing my name below, I, Bethel Born, attest that this documentation has been prepared under the direction and in the presence of Benjiman Core, MD. Electronically Signed: Bethel Born, ED Scribe. 11/06/2015. 10:06 PM   Chief Complaint  Patient presents with  . Chest Pain   The history is provided by the patient. No language interpreter was used.   Laura Simpson is a 40 y.o. female with history of GERD who presents to the Emergency Department complaining of new, squeezing/ stabbing, 5/10 in severity,  mid chest pain with sudden onset near 7 PM tonight while at rest. The pain radiates to the right side of the jaw. This pain is different than her typical GERD pain. Associated symptoms include dizziness and fatigue. Pt denies SOB, cough,  fever, and chills.  She does not smoke. Pt has no cardiac history and has never had a stress test or cardiac catheterization.    Past Medical History  Diagnosis Date  . Hypoglycemia   . Tachycardia   . Migraines   . GERD (gastroesophageal reflux disease)    Past Surgical History  Procedure Laterality Date  . Abdominal hysterectomy    . Tubal ligation     History reviewed. No pertinent family history. Social History  Substance Use Topics  . Smoking status: Never Smoker   . Smokeless tobacco: None  . Alcohol Use: No   OB History    No data available     Review of Systems  Constitutional: Positive for fatigue. Negative for fever and chills.  Respiratory: Negative for cough and shortness of breath.   Cardiovascular: Positive for chest pain.  Neurological: Positive for dizziness.  All other systems reviewed and are negative.  Allergies  Review of patient's allergies indicates no known allergies.  Home Medications   Prior to Admission medications   Medication Sig Start Date End Date Taking? Authorizing Provider  SUMAtriptan (IMITREX) 25 MG tablet Take 25 mg by mouth  every 2 (two) hours as needed for migraine. May repeat in 2 hours if headache persists or recurs.   Yes Historical Provider, MD  famotidine (PEPCID) 20 MG tablet Take 1 tablet (20 mg total) by mouth 2 (two) times daily. 05/23/11 05/22/12  Forbes Cellar, MD  omeprazole (PRILOSEC) 20 MG capsule Take 20 mg by mouth daily.    Historical Provider, MD  ondansetron (ZOFRAN) 8 MG tablet Take 1 tablet (8 mg total) by mouth every 8 (eight) hours as needed for nausea. 02/21/14   Doug Sou, MD   BP 104/68 mmHg  Pulse 81  Temp(Src) 97.6 F (36.4 C) (Oral)  Resp 18  Ht  (1.803 m)  Wt 220 lb (99.791 kg)  BMI 30.70 kg/m2  SpO2 97% Physical Exam  Constitutional: She is oriented to person, place, and time. She appears well-developed and well-nourished. No distress.  HENT:  Head: Normocephalic and atraumatic.  Eyes: EOM are normal.  Neck: Normal range of motion.  Cardiovascular: Normal rate, regular rhythm and normal heart sounds.   No murmur heard. Pulmonary/Chest: Effort normal and breath sounds normal.  CTAB Mild right upper chest wall tenderness  Abdominal: Soft. She exhibits no distension. There is no tenderness.  Musculoskeletal: Normal range of motion.  No peripheral edema   Neurological: She is alert and oriented to person, place, and time.  Skin: Skin is warm and dry.  Psychiatric: She has a normal mood and affect. Judgment normal.  Nursing note and vitals reviewed.   ED Course  Procedures (including critical care time) COORDINATION OF CARE: 10:00 PM Discussed treatment plan which includes lab work, CXR, EKG with pt at bedside and pt agreed to plan.  Labs Review Labs Reviewed  TROPONIN I    Imaging Review Dg Chest 2 View  11/06/2015  CLINICAL DATA:  Acute onset of generalized chest burning and bilateral jaw pain. Pain radiates to right arm. Nausea. Initial encounter. EXAM: CHEST  2 VIEW COMPARISON:  None. FINDINGS: The lungs are well-aerated. Minimal left basilar  atelectasis is noted. There is no evidence of pleural effusion or pneumothorax. The heart is borderline normal in size. No acute osseous abnormalities are seen. IMPRESSION: Minimal left basilar atelectasis noted.  Lungs otherwise clear Electronically Signed   By: Roanna Raider M.D.   On: 11/06/2015 21:14   I have personally reviewed and evaluated these images and lab results as part of my medical decision-making.   EKG Interpretation None      MDM   Final diagnoses:  Chest pain, unspecified chest pain type    Patient with chest pain. EKG and x-ray reassuring. Negative troponin. Will discharge home. Doubt cardiac cause or pulmonary embolism.  I personally performed the services described in this documentation, which was scribed in my presence. The recorded information has been reviewed and is accurate.      Benjiman Core, MD 11/07/15 [phone]

## 2017-01-01 ENCOUNTER — Telehealth: Payer: Self-pay | Admitting: *Deleted

## 2017-01-01 DIAGNOSIS — R5383 Other fatigue: Secondary | ICD-10-CM

## 2017-01-01 DIAGNOSIS — Z13220 Encounter for screening for lipoid disorders: Secondary | ICD-10-CM

## 2017-01-01 NOTE — Telephone Encounter (Signed)
Labs ordered for upcoming appt

## 2017-01-03 LAB — COMPREHENSIVE METABOLIC PANEL
ALT: 8 U/L (ref 6–29)
AST: 12 U/L (ref 10–30)
Albumin: 4.2 g/dL (ref 3.6–5.1)
Alkaline Phosphatase: 52 U/L (ref 33–115)
BUN: 16 mg/dL (ref 7–25)
CO2: 22 mmol/L (ref 20–31)
Calcium: 9.2 mg/dL (ref 8.6–10.2)
Chloride: 105 mmol/L (ref 98–110)
Creat: 0.99 mg/dL (ref 0.50–1.10)
Glucose, Bld: 92 mg/dL (ref 65–99)
Potassium: 4.4 mmol/L (ref 3.5–5.3)
Sodium: 138 mmol/L (ref 135–146)
TOTAL PROTEIN: 6.8 g/dL (ref 6.1–8.1)
Total Bilirubin: 0.7 mg/dL (ref 0.2–1.2)

## 2017-01-03 LAB — CBC
HCT: 41.6 % (ref 35.0–45.0)
Hemoglobin: 14 g/dL (ref 11.7–15.5)
MCH: 31.7 pg (ref 27.0–33.0)
MCHC: 33.7 g/dL (ref 32.0–36.0)
MCV: 94.1 fL (ref 80.0–100.0)
MPV: 11.4 fL (ref 7.5–12.5)
Platelets: 227 10*3/uL (ref 140–400)
RBC: 4.42 MIL/uL (ref 3.80–5.10)
RDW: 12.9 % (ref 11.0–15.0)
WBC: 5.4 10*3/uL (ref 3.8–10.8)

## 2017-01-03 LAB — LIPID PANEL W/REFLEX DIRECT LDL
CHOL/HDL RATIO: 2.8 ratio (ref <5.0)
Cholesterol: 161 mg/dL (ref <200)
HDL: 57 mg/dL (ref >50)
LDL-CHOLESTEROL: 88 mg/dL
Non-HDL Cholesterol (Calc): 104 mg/dL (ref <130)
Triglycerides: 72 mg/dL (ref <150)

## 2017-01-03 LAB — VITAMIN B12: Vitamin B-12: 324 pg/mL (ref 200–1100)

## 2017-01-03 LAB — FERRITIN: Ferritin: 145 ng/mL (ref 10–232)

## 2017-01-03 LAB — TSH: TSH: 1.58 mIU/L

## 2017-01-04 LAB — VITAMIN D 25 HYDROXY (VIT D DEFICIENCY, FRACTURES): VIT D 25 HYDROXY: 34 ng/mL (ref 30–100)

## 2017-01-04 LAB — SEDIMENTATION RATE: Sed Rate: 5 mm/hr (ref 0–20)

## 2017-01-04 LAB — C-REACTIVE PROTEIN: CRP: 2.7 mg/L (ref <8.0)

## 2017-01-07 NOTE — Telephone Encounter (Signed)
Call pt: inflammation markers normal.  Vitamin D just above 30. Consider daily D3 1000 units.  Kidney, liver, glucose great.  Cholesterol great.  Thyroid normal.  Not anemic.  B12 on low side of normal. Start b12 daily.

## 2017-01-08 ENCOUNTER — Encounter: Payer: Self-pay | Admitting: Physician Assistant

## 2017-01-08 ENCOUNTER — Ambulatory Visit (INDEPENDENT_AMBULATORY_CARE_PROVIDER_SITE_OTHER): Payer: 59 | Admitting: Physician Assistant

## 2017-01-08 VITALS — BP 92/55 | HR 78 | Ht 71.0 in | Wt 179.0 lb

## 2017-01-08 DIAGNOSIS — Z86018 Personal history of other benign neoplasm: Secondary | ICD-10-CM

## 2017-01-08 DIAGNOSIS — Z1231 Encounter for screening mammogram for malignant neoplasm of breast: Secondary | ICD-10-CM

## 2017-01-08 DIAGNOSIS — R5383 Other fatigue: Secondary | ICD-10-CM | POA: Insufficient documentation

## 2017-01-08 DIAGNOSIS — N941 Unspecified dyspareunia: Secondary | ICD-10-CM | POA: Diagnosis not present

## 2017-01-08 DIAGNOSIS — I959 Hypotension, unspecified: Secondary | ICD-10-CM | POA: Diagnosis not present

## 2017-01-08 NOTE — Progress Notes (Signed)
Subjective:    Patient ID: Laura Simpson, female    DOB: Nov 27, 1975, 42 y.o.   MRN: [id number]  HPI  Pt is a 41 yo female who presents to the clinic to establish care.   .. Active Ambulatory Problems    Diagnosis Date Noted  . 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 Hypotension 01/08/2017  . No energy 01/08/2017   Resolved Ambulatory Problems    Diagnosis Date Noted  . No Resolved Ambulatory Problems   Past Medical History:  Diagnosis Date  . Hypoglycemia   . Migraines   . Tachycardia    .Marland Kitchen Family History  Problem Relation Age of Onset  . Hyperlipidemia Father   . Hypertension Father   . Stroke Maternal Grandmother   . Cancer Paternal Grandmother     kidney  . Alcohol abuse Paternal Grandfather   . Hypertension Paternal Grandfather   . Liver disease Paternal Grandfather    .Marland Kitchen Social History   Social History  . Marital status: Married    Spouse name: N/A  . Number of children: N/A  . Years of education: N/A   Occupational History  . Not on file.   Social History Main Topics  . Smoking status: Never Smoker  . Smokeless tobacco: Never Used  . Alcohol use No  . Drug use: No  . Sexual activity: Yes    Birth control/ protection: Surgical   Other Topics Concern  . Not on file   Social History Narrative  . No narrative on file   Pt has lost 70lbs in last 3 years with diet and exercise. 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 She still has her left ovarian. She is concerned about other tumors growning on ovary. She is having some pain with intercourse but denies any dryness. No bleeding. Her main concern is making sure she is not anemic or vitamin deficient. She is very tired. She admits to being is [employer] school and stressed with graduation nearing. She does not exercise. She is still trying to lose weight. She sleeps great.     Review of Systems    see HPI>  Objective:   Physical Exam  Constitutional: She is oriented to  person, place, and time. She appears well-developed and well-nourished.  HENT:  Head: Normocephalic and atraumatic.  Cardiovascular: Normal rate, regular rhythm and normal heart sounds.   Pulmonary/Chest: Effort normal and breath sounds normal.  Abdominal: Soft. Bowel sounds are normal. She exhibits no distension and no mass. There is no tenderness. There is no rebound and no guarding.  Neurological: She is alert and oriented to person, place, and time.  Psychiatric: She has a normal mood and affect. Her behavior is normal.          Assessment & Plan:  Marland KitchenMarland KitchenDiagnoses and all orders for this visit:  Pain in female genitalia on intercourse -     US Pelvis Complete -     US Transvaginal Non-OB  Visit for screening mammogram -     MM DIGITAL SCREENING BILATERAL; Future -     MM DIGITAL SCREENING BILATERAL  History of benign ovarian tumor -     US Pelvis Complete -     US Transvaginal Non-OB  No energy  Hypotension, unspecified hypotension type      .Marland Kitchen Depression screen PHQ 2/9 01/08/2017  Decreased Interest 0  Down, Depressed, Hopeless 0  PHQ - 2 Score 0   Due to hx and painful intercourse  will get u/s.  Labs done and no overt reason for fatigue. Discussed lower levels of b12 and vitamin D. Encouraged to start 1000 units of vitamin D and 1000 mg of b12.   BP low. Not on any medications. Could be causing fatigue. Discussed increasing salt and hydration in diet. Continue to monitor.   Pt is in a great BMI range today.

## 2017-01-17 ENCOUNTER — Other Ambulatory Visit: Payer: Self-pay | Admitting: Physician Assistant

## 2017-01-17 MED ORDER — TRIAMCINOLONE ACETONIDE 0.1 % EX CREA: 1.0000 "application " | TOPICAL_CREAM | Freq: Two times a day (BID) | CUTANEOUS | 0 refills | Status: DC

## 2017-01-17 NOTE — Progress Notes (Signed)
Pt complains of itching with small bumps on abdomen. Appears like contact dermatitis. Will send triamcinolone.

## 2017-01-23 ENCOUNTER — Other Ambulatory Visit: Payer: 59

## 2017-01-23 ENCOUNTER — Ambulatory Visit: Payer: 59

## 2017-01-29 ENCOUNTER — Ambulatory Visit (INDEPENDENT_AMBULATORY_CARE_PROVIDER_SITE_OTHER): Payer: 59

## 2017-01-29 DIAGNOSIS — Z90721 Acquired absence of ovaries, unilateral: Secondary | ICD-10-CM | POA: Diagnosis not present

## 2017-01-29 DIAGNOSIS — Z1231 Encounter for screening mammogram for malignant neoplasm of breast: Secondary | ICD-10-CM | POA: Diagnosis not present

## 2017-01-29 DIAGNOSIS — N941 Unspecified dyspareunia: Secondary | ICD-10-CM | POA: Diagnosis not present

## 2017-01-29 DIAGNOSIS — Z90710 Acquired absence of both cervix and uterus: Secondary | ICD-10-CM

## 2017-01-29 NOTE — Progress Notes (Signed)
Discussed with patient in office

## 2017-01-29 NOTE — Progress Notes (Signed)
Normal mammogram. Follow up in one year.

## 2017-11-13 ENCOUNTER — Emergency Department (HOSPITAL_BASED_OUTPATIENT_CLINIC_OR_DEPARTMENT_OTHER): Payer: 59

## 2017-11-13 ENCOUNTER — Other Ambulatory Visit: Payer: Self-pay

## 2017-11-13 ENCOUNTER — Encounter (HOSPITAL_BASED_OUTPATIENT_CLINIC_OR_DEPARTMENT_OTHER): Payer: Self-pay

## 2017-11-13 ENCOUNTER — Emergency Department (HOSPITAL_BASED_OUTPATIENT_CLINIC_OR_DEPARTMENT_OTHER)
Admission: EM | Admit: 2017-11-13 | Discharge: 2017-11-14 | Disposition: A | Discharge status: Home / Self Care | Payer: 59 | Attending: Emergency Medicine | Admitting: Emergency Medicine

## 2017-11-13 DIAGNOSIS — R0602 Shortness of breath: Secondary | ICD-10-CM | POA: Insufficient documentation

## 2017-11-13 DIAGNOSIS — R42 Dizziness and giddiness: Secondary | ICD-10-CM | POA: Insufficient documentation

## 2017-11-13 DIAGNOSIS — R0789 Other chest pain: Secondary | ICD-10-CM | POA: Diagnosis not present

## 2017-11-13 DIAGNOSIS — R002 Palpitations: Secondary | ICD-10-CM

## 2017-11-13 LAB — URINALYSIS, ROUTINE W REFLEX MICROSCOPIC
Bilirubin Urine: NEGATIVE
Glucose, UA: NEGATIVE mg/dL
HGB URINE DIPSTICK: NEGATIVE
Ketones, ur: NEGATIVE mg/dL
LEUKOCYTES UA: NEGATIVE
NITRITE: NEGATIVE
PROTEIN: NEGATIVE mg/dL
pH: 7 (ref 5.0–8.0)

## 2017-11-13 LAB — BASIC METABOLIC PANEL
ANION GAP: 10 (ref 5–15)
BUN: 13 mg/dL (ref 6–20)
CHLORIDE: 101 mmol/L (ref 101–111)
CO2: 24 mmol/L (ref 22–32)
Calcium: 9.4 mg/dL (ref 8.9–10.3)
Creatinine, Ser: 0.89 mg/dL (ref 0.44–1.00)
GFR calc non Af Amer: >60 mL/min (ref >60)
Glucose, Bld: 106 mg/dL — ABNORMAL HIGH (ref 65–99)
Potassium: 3.8 mmol/L (ref 3.5–5.1)
Sodium: 135 mmol/L (ref 135–145)

## 2017-11-13 LAB — CBC WITH DIFFERENTIAL/PLATELET
BASOS ABS: 0.1 10*3/uL (ref 0.0–0.1)
BASOS PCT: 1 %
Eosinophils Absolute: 0.4 10*3/uL (ref 0.0–0.7)
Eosinophils Relative: 5 %
HCT: 39.8 % (ref 36.0–46.0)
Hemoglobin: 13.6 g/dL (ref 12.0–15.0)
LYMPHS ABS: 2.1 10*3/uL (ref 0.7–4.0)
Lymphocytes Relative: 27 %
MCH: 31.9 pg (ref 26.0–34.0)
MCHC: 34.2 g/dL (ref 30.0–36.0)
MCV: 93.2 fL (ref 78.0–100.0)
Monocytes Absolute: 0.6 10*3/uL (ref 0.1–1.0)
Monocytes Relative: 8 %
Neutro Abs: 4.9 10*3/uL (ref 1.7–7.7)
Neutrophils Relative %: 59 %
PLATELETS: 203 10*3/uL (ref 150–400)
RBC: 4.27 MIL/uL (ref 3.87–5.11)
RDW: 12.3 % (ref 11.5–15.5)
WBC: 8.1 10*3/uL (ref 4.0–10.5)

## 2017-11-13 LAB — TROPONIN I

## 2017-11-13 NOTE — ED Notes (Signed)
ED Provider at bedside. 

## 2017-11-13 NOTE — ED Triage Notes (Signed)
C/o SOB, rapid heat rate x 1 week -c/o tingling to left arm and left leg x today-feeling a "heaviness" to throat x 1 week-NAD/anxious-steady gait

## 2017-11-13 NOTE — ED Provider Notes (Addendum)
[facility] Provider Note: [name], MD, FACEP  CSN: [id number] MRN: [id number] ARRIVAL: 11/13/17 at 2238 ROOM: [facility]   CHIEF COMPLAINT  Shortness of Breath   HISTORY OF PRESENT ILLNESS  11/13/17 11:16 PM Laura Simpson is a 42 y.o. female who complains of shortness of breath, sensation that her heart is beating rapidly and lightheadedness throughout the day today.  She went to bed about 7 PM but was awakened about 9 PM with an exacerbation of her symptoms.  She developed sharp, squeezing type pain in her left upper chest that radiated to her back.  This is associated with heaviness and numbness in her left arm which extended to her left leg for a few seconds.  The shortness of breath and sensation of rapid heartbeat worsened as well.  These have subsequently improved but not abated except for her chest discomfort which is not present currently.  She acknowledges that the symptoms are making her feel anxious.  She does have a remote history of anxiety.  There are no specific exacerbating or mitigating factors.  Symptoms are mild to moderate at the present time.   Past Medical History:  Diagnosis Date  . Hypoglycemia   . Migraines   . Tachycardia     Past Surgical History:  Procedure Laterality Date  . ABDOMINAL HYSTERECTOMY    . OOPHORECTOMY Right   . TUBAL LIGATION      Family History  Problem Relation Age of Onset  . Hyperlipidemia Father   . Hypertension Father   . Stroke Maternal Grandmother   . Cancer Paternal Grandmother        kidney  . Alcohol abuse Paternal Grandfather   . Hypertension Paternal Grandfather   . Liver disease Paternal Grandfather     Social History   Tobacco Use  . Smoking status: Never Smoker  . Smokeless tobacco: Never Used  Substance Use Topics  . Alcohol use: No  . Drug use: No    Prior to Admission medications   Not on File    Allergies Patient has no known allergies.   REVIEW OF SYSTEMS  Negative except  as noted here or in the History of Present Illness.   PHYSICAL EXAMINATION  Initial Vital Signs Blood pressure 113/80, pulse 93, temperature 97.6 F (36.4 C), temperature source Oral, resp. rate 20, height 5\' 11"  (1.803 m), weight 80.7 kg (178 lb), SpO2 100 %.  Examination General: Well-developed, well-nourished female in no acute distress; appearance consistent with age of record HENT: normocephalic; atraumatic Eyes: pupils equal, round and reactive to light; extraocular muscles intact Neck: supple Heart: regular rate and rhythm Lungs: clear to auscultation bilaterally Abdomen: soft; nondistended; nontender; no masses or hepatosplenomegaly; bowel sounds present Extremities: No deformity; full range of motion; pulses normal Neurologic: Awake, alert and oriented; motor function intact in all extremities and symmetric; no facial droop Skin: Warm and dry Psychiatric: Anxious   RESULTS  Summary of this visit's results, reviewed by myself:   EKG Interpretation  Date/Time:  Wednesday November 13 2017 22:53:14 EST Ventricular Rate:  83 PR Interval:    QRS Duration: 88 QT Interval:  375 QTC Calculation: 441 R Axis:   79 Text Interpretation:  Sinus rhythm ST elev, probable normal early repol pattern Rate is slower Confirmed by Merlin Golden, Jonny Ruiz ([id number]) on 11/13/2017 10:56:13 PM      Laboratory Studies: Results for orders placed or performed during the hospital encounter of 11/13/17 (from the past 24 hour(s))  CBC with Differential/Platelet  Status: None   Collection Time: 11/13/17 11:30 PM  Result Value Ref Range   WBC 8.1 4.0 - 10.5 K/uL   RBC 4.27 3.87 - 5.11 MIL/uL   Hemoglobin 13.6 12.0 - 15.0 g/dL   HCT 84.639.8 96.236.0 - 95.246.0 %   MCV 93.2 78.0 - 100.0 fL   MCH 31.9 26.0 - 34.0 pg   MCHC 34.2 30.0 - 36.0 g/dL   RDW 84.112.3 32.411.5 - 40.115.5 %   Platelets 203 150 - 400 K/uL   Neutrophils Relative % 59 %   Neutro Abs 4.9 1.7 - 7.7 K/uL   Lymphocytes Relative 27 %   Lymphs Abs 2.1 0.7 -  4.0 K/uL   Monocytes Relative 8 %   Monocytes Absolute 0.6 0.1 - 1.0 K/uL   Eosinophils Relative 5 %   Eosinophils Absolute 0.4 0.0 - 0.7 K/uL   Basophils Relative 1 %   Basophils Absolute 0.1 0.0 - 0.1 K/uL  Basic metabolic panel     Status: Abnormal   Collection Time: 11/13/17 11:30 PM  Result Value Ref Range   Sodium 135 135 - 145 mmol/L   Potassium 3.8 3.5 - 5.1 mmol/L   Chloride 101 101 - 111 mmol/L   CO2 24 22 - 32 mmol/L   Glucose, Bld 106 (H) 65 - 99 mg/dL   BUN 13 6 - 20 mg/dL   Creatinine, Ser 0.270.89 0.44 - 1.00 mg/dL   Calcium 9.4 8.9 - 25.310.3 mg/dL   GFR calc non Af Amer >60 >60 mL/min   GFR calc Af Amer >60 >60 mL/min   Anion gap 10 5 - 15  Troponin I     Status: None   Collection Time: 11/13/17 11:30 PM  Result Value Ref Range   Troponin I <0.03 <0.03 ng/mL  Urinalysis, Routine w reflex microscopic     Status: Abnormal   Collection Time: 11/13/17 11:31 PM  Result Value Ref Range   Color, Urine YELLOW YELLOW   APPearance CLEAR CLEAR   Specific Gravity, Urine <1.005 (L) 1.005 - 1.030   pH 7.0 5.0 - 8.0   Glucose, UA NEGATIVE NEGATIVE mg/dL   Hgb urine dipstick NEGATIVE NEGATIVE   Bilirubin Urine NEGATIVE NEGATIVE   Ketones, ur NEGATIVE NEGATIVE mg/dL   Protein, ur NEGATIVE NEGATIVE mg/dL   Nitrite NEGATIVE NEGATIVE   Leukocytes, UA NEGATIVE NEGATIVE   Imaging Studies: Dg Chest 2 View  Result Date: 11/13/2017 CLINICAL DATA:  Tachycardia for week, chest heaviness and LEFT arm tingling. EXAM: CHEST  2 VIEW COMPARISON:  Chest radiograph November 06, 2015 FINDINGS: Cardiomediastinal silhouette is normal. No pleural effusions or focal consolidations. Mild bronchitic changes. Trachea projects midline and there is no pneumothorax. Soft tissue planes and included osseous structures are non-suspicious. IMPRESSION: Mild bronchitic changes without focal consolidation. Electronically Signed   By: Awilda Metroourtnay  Bloomer M.D.   On: 11/13/2017 23:11    ED COURSE  Nursing notes and  initial vitals signs, including pulse oximetry, reviewed.  Vitals:   11/13/17 2243 11/13/17 2244 11/14/17 0055  BP: 113/80  106/70  Pulse: 93  78  Resp: 20  18  Temp: 97.6 F (36.4 C)  98.2 F (36.8 C)  TempSrc: Oral  Oral  SpO2: 100%  98%  Weight:  80.7 kg (178 lb)   Height:  5\' 11"  (1.803 m)    12:14 AM Patient feeling better, left arm symptoms improving, no chest pain currently.  1:13 AM Patient denies symptoms at this time.  She thinks her symptoms  may be due to anxiety.  Her EKG was unremarkable and her rhythm strip has shown sinus rhythm without arrhythmia or ectopy.  Laboratory studies are reassuring.  Chest x-ray shows bronchitic changes but lungs are clear on exam and patient denies coughing.  PROCEDURES    ED DIAGNOSES     ICD-10-CM   1. Shortness of breath R06.02   2. Palpitations R00.2   3. Atypical chest pain R07.89        Paula Libra, MD 11/14/17 0114    Paula Libra, MD 11/14/17 1324

## 2018-06-06 ENCOUNTER — Encounter (HOSPITAL_BASED_OUTPATIENT_CLINIC_OR_DEPARTMENT_OTHER): Payer: Self-pay | Admitting: *Deleted

## 2018-06-06 ENCOUNTER — Emergency Department (HOSPITAL_BASED_OUTPATIENT_CLINIC_OR_DEPARTMENT_OTHER)
Admission: EM | Admit: 2018-06-06 | Discharge: 2018-06-06 | Disposition: A | Discharge status: Home / Self Care | Payer: 59 | Attending: Emergency Medicine | Admitting: Emergency Medicine

## 2018-06-06 ENCOUNTER — Emergency Department (HOSPITAL_BASED_OUTPATIENT_CLINIC_OR_DEPARTMENT_OTHER): Payer: 59

## 2018-06-06 ENCOUNTER — Other Ambulatory Visit: Payer: Self-pay

## 2018-06-06 DIAGNOSIS — S060X0A Concussion without loss of consciousness, initial encounter: Secondary | ICD-10-CM | POA: Diagnosis not present

## 2018-06-06 DIAGNOSIS — Y93E9 Activity, other interior property and clothing maintenance: Secondary | ICD-10-CM | POA: Diagnosis not present

## 2018-06-06 DIAGNOSIS — Y999 Unspecified external cause status: Secondary | ICD-10-CM | POA: Diagnosis not present

## 2018-06-06 DIAGNOSIS — W2209XA Striking against other stationary object, initial encounter: Secondary | ICD-10-CM | POA: Insufficient documentation

## 2018-06-06 DIAGNOSIS — Y92003 Bedroom of unspecified non-institutional (private) residence as the place of occurrence of the external cause: Secondary | ICD-10-CM | POA: Diagnosis not present

## 2018-06-06 DIAGNOSIS — S0990XA Unspecified injury of head, initial encounter: Secondary | ICD-10-CM | POA: Diagnosis present

## 2018-06-06 MED ORDER — ONDANSETRON HCL 4 MG PO TABS: 4.0000 mg | ORAL_TABLET | Freq: Three times a day (TID) | ORAL | 0 refills | Status: AC | PRN

## 2018-06-06 MED ORDER — KETOROLAC TROMETHAMINE 30 MG/ML IJ SOLN
30.0000 mg | Freq: Once | INTRAMUSCULAR | Status: AC
  Administered 2018-06-06: 30 mg via INTRAVENOUS
  Filled 2018-06-06: qty 1

## 2018-06-06 MED ORDER — DIPHENHYDRAMINE HCL 50 MG/ML IJ SOLN
25.0000 mg | Freq: Once | INTRAMUSCULAR | Status: AC
  Administered 2018-06-06: 25 mg via INTRAVENOUS
  Filled 2018-06-06: qty 1

## 2018-06-06 MED ORDER — SODIUM CHLORIDE 0.9 % IV BOLUS
1000.0000 mL | Freq: Once | INTRAVENOUS | Status: AC
  Administered 2018-06-06: 1000 mL via INTRAVENOUS

## 2018-06-06 MED ORDER — METOCLOPRAMIDE HCL 5 MG/ML IJ SOLN
10.0000 mg | Freq: Once | INTRAMUSCULAR | Status: AC
  Administered 2018-06-06: 10 mg via INTRAVENOUS
  Filled 2018-06-06: qty 2

## 2018-06-06 NOTE — ED Provider Notes (Signed)
[facility] EMERGENCY DEPARTMENT Provider Note  CSN: [id number] Arrival date & time: 06/06/18  1447   History   Chief Complaint Chief Complaint  Patient presents with  . Head Injury    HPI Laura Simpson is a 42 y.o. female with a medical history of migraines who presented to the ED following a head injury. Patient states that she was picking up items and cleaning her bedside area at home when she turned abruptly and hit the left side of her head/face on the wooden bedpost. Denies LOC. Patient states "I just don't feel right." Describes throbbing headache, decreased concentration and nausea. Denies facial droop, slurred speech, vision changes, paresthesias or weakness. Patient is not on anticoagulant. Patient was ambulatory after the incident.  Past Medical History:  Diagnosis Date  . Hypoglycemia   . Migraines   . Tachycardia     Patient Active Problem List   Diagnosis Date Noted  . 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 Hypotension 01/08/2017  . No energy 01/08/2017    Past Surgical History:  Procedure Laterality Date  . ABDOMINAL HYSTERECTOMY    . OOPHORECTOMY Right   . TUBAL LIGATION       OB History   None      Home Medications    Prior to Admission medications   Medication Sig Start Date End Date Taking? Authorizing Provider  ondansetron (ZOFRAN) 4 MG tablet Take 1 tablet (4 mg total) by mouth every 8 (eight) hours as needed for up to 5 days for nausea or vomiting. 06/06/18 06/11/18  Mortis, Sharyon Medicus, PA-C    Family History Family History  Problem Relation Age of Onset  . Hyperlipidemia Father   . Hypertension Father   . Stroke Maternal Grandmother   . Cancer Paternal Grandmother        kidney  . Alcohol abuse Paternal Grandfather   . Hypertension Paternal Grandfather   . Liver disease Paternal Grandfather     Social History Social History   Tobacco Use  . Smoking status: Never Smoker  . Smokeless tobacco: Never Used  Substance  Use Topics  . Alcohol use: No  . Drug use: No     Allergies   Patient has no known allergies.   Review of Systems Review of Systems  Constitutional: Negative for chills and fever.  HENT: Negative.   Eyes: Positive for photophobia. Negative for pain and visual disturbance.  Respiratory: Negative.   Cardiovascular: Negative.   Gastrointestinal: Negative.   Musculoskeletal: Negative.   Skin: Negative.   Neurological: Positive for headaches. Negative for dizziness, syncope, speech difficulty, weakness, light-headedness and numbness.  Psychiatric/Behavioral: Positive for decreased concentration. The patient is nervous/anxious.    Physical Exam Updated Vital Signs BP (!) 91/55   Pulse 76   Temp 98.2 F (36.8 C) (Oral)   Resp 18   Ht 5\' 11"  (1.803 m)   Wt 76.2 kg   SpO2 99%   BMI 23.43 kg/m   Physical Exam  Constitutional: She is oriented to person, place, and time. She appears well-developed and well-nourished.  Tearful  HENT:  Head: Normocephalic and atraumatic.  Right Ear: Tympanic membrane, external ear and ear canal normal.  Left Ear: Tympanic membrane, external ear and ear canal normal.  Eyes: Pupils are equal, round, and reactive to light. Conjunctivae, EOM and lids are normal.  Neck: Trachea normal, normal range of motion, full passive range of motion without pain and phonation normal. Neck supple. No spinous process tenderness and no  muscular tenderness present. Normal range of motion present.  Cardiovascular: Normal rate, regular rhythm, normal heart sounds, intact distal pulses and normal pulses.  No murmur heard. Pulmonary/Chest: Effort normal and breath sounds normal.  Neurological: She is alert and oriented to person, place, and time. She has normal strength. No cranial nerve deficit or sensory deficit. She exhibits normal muscle tone. GCS eye subscore is 4. GCS verbal subscore is 5. GCS motor subscore is 6.  Reflex Scores:      Tricep reflexes are 2+ on the  right side and 2+ on the left side.      Bicep reflexes are 2+ on the right side and 2+ on the left side.      Brachioradialis reflexes are 2+ on the right side and 2+ on the left side.      Patellar reflexes are 2+ on the right side and 2+ on the left side.      Achilles reflexes are 2+ on the right side and 2+ on the left side. Skin: Skin is warm and intact. Capillary refill takes less than 2 seconds. No abrasion, no bruising and no ecchymosis noted.  Psychiatric: Her speech is normal and behavior is normal. Thought content normal. Her mood appears anxious. She is attentive.  Nursing note and vitals reviewed.  ED Treatments / Results  Labs (all labs ordered are listed, but only abnormal results are displayed) Labs Reviewed - No data to display  EKG None  Radiology Ct Head Wo Contrast  Result Date: 06/06/2018 CLINICAL DATA:  Hit head on bed post today, nausea, blurred vision, LOC briefly- few secondsNo hx HTN, no previous hx of head traumaStates that she doesn't feel like herself EXAM: CT HEAD WITHOUT CONTRAST TECHNIQUE: Contiguous axial images were obtained from the base of the skull through the vertex without intravenous contrast. COMPARISON:  Head CT, 06/21/2014 FINDINGS: Brain: No evidence of acute infarction, hemorrhage, hydrocephalus, extra-axial collection or mass lesion/mass effect. Vascular: No hyperdense vessel or unexpected calcification. Skull: Normal. Negative for fracture or focal lesion. Sinuses/Orbits: Globes and orbits are unremarkable. There are air-fluid levels in both maxillary sinuses. Mild mucosal thickening lines the right maxillary sinus and multiple ethmoid air cells, right greater than left. Small mucous retention cyst in the left sphenoid sinus. Remaining sinuses are clear. Clear mastoid air cells and middle ear cavities. Other: None. IMPRESSION: 1. No intracranial abnormality.  No skull fracture. 2. Sinus disease including dependent maxillary air-fluid levels. Consider  acute sinusitis in the proper clinical setting. Electronically Signed   By: Amie Portland M.D.   On: 06/06/2018 15:50    Procedures Procedures (including critical care time)  Medications Ordered in ED Medications  sodium chloride 0.9 % bolus 1,000 mL (0 mLs Intravenous Stopped 06/06/18 1604)  metoCLOPramide (REGLAN) injection 10 mg (10 mg Intravenous Given 06/06/18 1523)  diphenhydrAMINE (BENADRYL) injection 25 mg (25 mg Intravenous Given 06/06/18 1521)  ketorolac (TORADOL) 30 MG/ML injection 30 mg (30 mg Intravenous Given 06/06/18 1520)     Initial Impression / Assessment and Plan / ED Course  Triage vital signs and the nursing notes have been reviewed.  Pertinent labs & imaging results that were available during care of the patient were reviewed and considered in medical decision making (see chart for details).  Patient presents following a head injury where she accidentally hit her head on her bed post, but fortunately had no LOC. This occurred approx. 6 hours prior to coming to the  ED. She complains of headache, nausea and  decreased focus. Patient is visibly anxious and concerned about a more acute injury that could have occurred and is tearful through the initial interview. On physical exam, patient has no focal neuro deficits and is oriented x3. Other than anxiety, there are no s/s that raise concern for an acute intracranial process like stroke or hemorrhage. She has no additional risk factors such as anticoagulation or excessive EtOH use that would put her at increased risk for this. Thorough reassurance was provided to the patient. While she demonstrated understanding, she insisted on head imaging for more definitive evaluation.   Clinical Course as of Jun 06 1658  Fri Jun 06, 2018  1551 CT Head normal. No acute intra-cranial processes found.   [GM]    Clinical Course User Index [GM] Mortis, Sharyon Medicus, PA-C     Final Clinical Impressions(s) / ED Diagnoses  1. Head Injury.  Symptoms consistent with concussion. Education provided on supportive treatment for injury and s/s that would warrant return to the ED vs follow-up with PCP.  Dispo: Home. After thorough clinical evaluation, this patient is determined to be medically stable and can be safely discharged with the previously mentioned treatment and/or outpatient follow-up/referral(s). At this time, there are no other apparent medical conditions that require further screening, evaluation or treatment.  Final diagnoses:  Concussion without loss of consciousness, initial encounter    ED Discharge Orders         Ordered    ondansetron (ZOFRAN) 4 MG tablet  Every 8 hours PRN     06/06/18 1558            Mortis, [place] I, PA-C 06/06/18 1659    Alvira Monday, MD 06/07/18 [phone]

## 2018-06-06 NOTE — Discharge Instructions (Signed)
Your head imaging was normal today. No signs of any bleeding or skull fractures.  As we discussed, you likely suffered a concussion. I recommend that you mentally and physically rest over the next few days. I have included some information on concussions for you. Tylenol and/or Ibuprofen can be used for the headache. I have prescribed you Zofran which helps with nausea.  Please take this weekend to rest.

## 2018-06-06 NOTE — ED Triage Notes (Signed)
This am she hit the left side of her head on the bed post. Nausea and not feeling right in her head since. She is alert and oriented at triage. Ambulatory.

## 2021-01-27 ENCOUNTER — Emergency Department (HOSPITAL_BASED_OUTPATIENT_CLINIC_OR_DEPARTMENT_OTHER): Payer: 59

## 2021-01-27 ENCOUNTER — Other Ambulatory Visit: Payer: Self-pay

## 2021-01-27 ENCOUNTER — Emergency Department (HOSPITAL_BASED_OUTPATIENT_CLINIC_OR_DEPARTMENT_OTHER)
Admission: EM | Admit: 2021-01-27 | Discharge: 2021-01-27 | Disposition: A | Discharge status: Home / Self Care | Payer: 59 | Attending: Emergency Medicine | Admitting: Emergency Medicine

## 2021-01-27 ENCOUNTER — Encounter (HOSPITAL_BASED_OUTPATIENT_CLINIC_OR_DEPARTMENT_OTHER): Payer: Self-pay | Admitting: Emergency Medicine

## 2021-01-27 DIAGNOSIS — R251 Tremor, unspecified: Secondary | ICD-10-CM | POA: Diagnosis present

## 2021-01-27 LAB — CBC WITH DIFFERENTIAL/PLATELET
Abs Immature Granulocytes: 0.01 10*3/uL (ref 0.00–0.07)
Basophils Absolute: 0.1 10*3/uL (ref 0.0–0.1)
Basophils Relative: 1 %
Eosinophils Absolute: 0.4 10*3/uL (ref 0.0–0.5)
Eosinophils Relative: 6 %
HCT: 43.7 % (ref 36.0–46.0)
Hemoglobin: 14.6 g/dL (ref 12.0–15.0)
Immature Granulocytes: 0 %
Lymphocytes Relative: 22 %
Lymphs Abs: 1.5 10*3/uL (ref 0.7–4.0)
MCH: 31.7 pg (ref 26.0–34.0)
MCHC: 33.4 g/dL (ref 30.0–36.0)
MCV: 95 fL (ref 80.0–100.0)
Monocytes Absolute: 0.5 10*3/uL (ref 0.1–1.0)
Monocytes Relative: 7 %
Neutro Abs: 4.3 10*3/uL (ref 1.7–7.7)
Neutrophils Relative %: 64 %
Platelets: 249 10*3/uL (ref 150–400)
RBC: 4.6 MIL/uL (ref 3.87–5.11)
RDW: 12.7 % (ref 11.5–15.5)
WBC: 6.8 10*3/uL (ref 4.0–10.5)
nRBC: 0 % (ref 0.0–0.2)

## 2021-01-27 LAB — CBG MONITORING, ED: Glucose-Capillary: 96 mg/dL (ref 70–99)

## 2021-01-27 LAB — BASIC METABOLIC PANEL
Anion gap: 9 (ref 5–15)
BUN: 10 mg/dL (ref 6–20)
CO2: 24 mmol/L (ref 22–32)
Calcium: 9.2 mg/dL (ref 8.9–10.3)
Chloride: 103 mmol/L (ref 98–111)
Creatinine, Ser: 1.07 mg/dL — ABNORMAL HIGH (ref 0.44–1.00)
GFR, Estimated: >60 mL/min (ref >60)
Glucose, Bld: 103 mg/dL — ABNORMAL HIGH (ref 70–99)
Potassium: 4.1 mmol/L (ref 3.5–5.1)
Sodium: 136 mmol/L (ref 135–145)

## 2021-01-27 MED ORDER — LORAZEPAM 2 MG/ML IJ SOLN
1.0000 mg | Freq: Once | INTRAMUSCULAR | Status: AC
  Administered 2021-01-27: 1 mg via INTRAVENOUS
  Filled 2021-01-27: qty 1

## 2021-01-27 NOTE — ED Provider Notes (Signed)
[facility] EMERGENCY DEPARTMENT Provider Note   CSN: [id number] Arrival date & time: 01/27/21  1245     History Chief Complaint  Patient presents with  . Neurologic Problem    Laura Simpson is a 45 y.o. female.  Patient states that around 10 AM she was taken a shower and she had some difficulty with her right vision and started to have tremors.  She has a history of TBI and gets a history of essential tremors on involve her head and body.  She thought that she was may be having a hard time getting words out.  She denies any real numbness or weakness.  Not having any vision problems now.  No headaches.  No pain anywhere.  Denies any suicidal homicidal ideation.  Does admit to anxiety and stress.  The history is provided by the patient.  Neurologic Problem This is a new problem. Pertinent negatives include no chest pain, no abdominal pain, no headaches and no shortness of breath. Nothing aggravates the symptoms. Nothing relieves the symptoms. She has tried nothing for the symptoms. The treatment provided no relief.       Past Medical History:  Diagnosis Date  . Hypoglycemia   . Migraines   . Tachycardia     Patient Active Problem List   Diagnosis Date Noted  . 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 Hypotension 01/08/2017  . No energy 01/08/2017    Past Surgical History:  Procedure Laterality Date  . ABDOMINAL HYSTERECTOMY    . OOPHORECTOMY Right   . TUBAL LIGATION       OB History   No obstetric history on file.     Family History  Problem Relation Age of Onset  . Hyperlipidemia Father   . Hypertension Father   . Stroke Maternal Grandmother   . Cancer Paternal Grandmother        kidney  . Alcohol abuse Paternal Grandfather   . Hypertension Paternal Grandfather   . Liver disease Paternal Grandfather     Social History   Tobacco Use  . Smoking status: Never Smoker  . Smokeless tobacco: Never Used  Substance Use Topics  . Alcohol use:  No  . Drug use: No    Home Medications Prior to Admission medications   Not on File    Allergies    Patient has no known allergies.  Review of Systems   Review of Systems  Constitutional: Negative for chills and fever.  HENT: Negative for ear pain and sore throat.   Eyes: Positive for visual disturbance. Negative for pain.  Respiratory: Negative for cough and shortness of breath.   Cardiovascular: Negative for chest pain and palpitations.  Gastrointestinal: Negative for abdominal pain and vomiting.  Genitourinary: Negative for dysuria and hematuria.  Musculoskeletal: Negative for arthralgias and back pain.  Skin: Negative for color change and rash.  Neurological: Positive for tremors and speech difficulty. Negative for dizziness, seizures, syncope, facial asymmetry, weakness, light-headedness, numbness and headaches.  All other systems reviewed and are negative.   Physical Exam Updated Vital Signs BP 93/68   Pulse 89   Temp 97.8 F (36.6 C) (Oral)   Resp 20   Ht 5\' 11"  (1.803 m)   Wt 100.5 kg   SpO2 100%   BMI 30.91 kg/m   Physical Exam Vitals and nursing note reviewed.  Constitutional:      General: She is not in acute distress.    Appearance: She is well-developed. She is not ill-appearing.  HENT:     Head: Normocephalic and atraumatic.     Right Ear: Tympanic membrane normal.     Left Ear: Tympanic membrane normal.     Mouth/Throat:     Mouth: Mucous membranes are moist.  Eyes:     Extraocular Movements: Extraocular movements intact.     Conjunctiva/sclera: Conjunctivae normal.     Pupils: Pupils are equal, round, and reactive to light.  Cardiovascular:     Rate and Rhythm: Normal rate and regular rhythm.     Pulses: Normal pulses.     Heart sounds: Normal heart sounds. No murmur heard.   Pulmonary:     Effort: Pulmonary effort is normal. No respiratory distress.     Breath sounds: Normal breath sounds.  Abdominal:     Palpations: Abdomen is soft.      Tenderness: There is no abdominal tenderness.  Musculoskeletal:     Cervical back: Normal range of motion and neck supple.  Skin:    General: Skin is warm and dry.     Capillary Refill: Capillary refill takes less than 2 seconds.  Neurological:     General: No focal deficit present.     Mental Status: She is alert and oriented to person, place, and time.     Cranial Nerves: No cranial nerve deficit.     Sensory: No sensory deficit.     Motor: No weakness.     Coordination: Coordination normal.     Comments: 5+ out of 5 strength throughout, normal sensation, normal finger-to-nose finger, normal speech, normal visual fields, no drift, does have some tremoring of her head and arms and overall gives a poor effort during strength examination but strength is equal throughout  Psychiatric:        Mood and Affect: Mood is anxious.     ED Results / Procedures / Treatments   Labs (all labs ordered are listed, but only abnormal results are displayed) Labs Reviewed  BASIC METABOLIC PANEL - Abnormal; Notable for the following components:      Result Value   Glucose, Bld 103 (*)    Creatinine, Ser 1.07 (*)    All other components within normal limits  CBC WITH DIFFERENTIAL/PLATELET  CBG MONITORING, ED    EKG EKG Interpretation  Date/Time:  Friday January 27 2021 12:50:09 EDT Ventricular Rate:  93 PR Interval:  136 QRS Duration: 83 QT Interval:  362 QTC Calculation: 451 R Axis:   75 Text Interpretation: Sinus rhythm Baseline wander in lead(s) V3 Confirmed by Virgina Norfolk ([id number]) on 01/27/2021 12:59:45 PM   Radiology CT Head Wo Contrast  Result Date: 01/27/2021 CLINICAL DATA:  Tremors. Additional history provided: Patient reports sudden onset of visual loss on right side at 10 a.m. while taking a shower, difficulty finding words. EXAM: CT HEAD WITHOUT CONTRAST TECHNIQUE: Contiguous axial images were obtained from the base of the skull through the vertex without intravenous contrast.  COMPARISON:  Prior head CT examinations 06/13/2018 and earlier. Brain MRI 06/13/2018. FINDINGS: Brain: Cerebral volume is normal. There is no acute intracranial hemorrhage. No demarcated cortical infarct. No extra-axial fluid collection. No evidence of intracranial mass. No midline shift. Vascular: No hyperdense vessel. Skull: Normal. Negative for fracture or focal lesion. Sinuses/Orbits: Visualized orbits show no acute finding. Postsurgical appearance of the paranasal sinuses. Mild mucosal thickening within the imaged frontal sinuses, ethmoidectomy cavities and left maxillary sinus. IMPRESSION: Unremarkable non-contrast CT appearance of the brain. No evidence of acute intracranial abnormality. Mild paranasal sinus mucosal thickening at  the imaged levels, as described. Electronically Signed   By: Jackey Loge DO   On: 01/27/2021 13:40    Procedures Procedures   Medications Ordered in ED Medications  LORazepam (ATIVAN) injection 1 mg (1 mg Intravenous Given 01/27/21 1304)    ED Course  I have reviewed the triage vital signs and the nursing notes.  Pertinent labs & imaging results that were available during my care of the patient were reviewed by me and considered in my medical decision making (see chart for details).    MDM Rules/Calculators/A&P                          Sherhonda Gaspar is here with tremors.  Patient with normal vitals.  No fever.  Patient while in the shower started to have tremors.  Had a hard time speaking.  Thought she has some right vision problems.  Neurologically she is intact.  She has a tremor of her upper bodies.  She states he had this in the past as she suffered a traumatic brain injury several years ago.  History and physical is not consistent with stroke.  History of depression, anxiety.  Overall suspect that this is psychiatric related.  Was given Ativan with great improvement.  She has no pain.  Normal head CT.  Normal labs and vitals.  Felt much better after Ativan.   Overall recommend follow-up with primary care doctor and psychiatry.  No major stroke risk factors.  Overall story not consistent with stroke.  Discharged in good condition.  This chart was dictated using voice recognition software.  Despite best efforts to proofread,  errors can occur which can change the documentation meaning.    Final Clinical Impression(s) / ED Diagnoses Final diagnoses:  Occasional tremors    Rx / DC Orders ED Discharge Orders    None       Virgina Norfolk, DO 01/27/21 1350

## 2021-01-27 NOTE — ED Notes (Signed)
Call to CT, coming to transport to CT

## 2021-01-27 NOTE — ED Notes (Signed)
Patient transported to CT 

## 2021-01-27 NOTE — ED Notes (Signed)
Dr Lockie Mola at bedside.  Declines code neuro activation at this time, will consult neurology once CT complete.  Ordered ativan iv

## 2021-01-27 NOTE — Discharge Instructions (Addendum)
Follow-up with your psychiatrist and primary care doctor.  Work-up today was unremarkable.

## 2021-01-27 NOTE — ED Triage Notes (Signed)
Reports sudden onset of visual loss on the right side at 1000 while taking a shower and difficulty finding her words.

## 2021-01-27 NOTE — ED Notes (Signed)
Pt transported to CT.  Pt relaxed and comfortable in bed.  States she "still feels weird."  Pt able to verbalize and answer questions appropriately.  No focal deficits noted at this time.  Pt tremors quieted.

## 2021-01-27 NOTE — ED Notes (Signed)
Pt returned from CT without incident

## 2022-01-10 ENCOUNTER — Emergency Department (HOSPITAL_BASED_OUTPATIENT_CLINIC_OR_DEPARTMENT_OTHER): Payer: 59

## 2022-01-10 ENCOUNTER — Other Ambulatory Visit: Payer: Self-pay

## 2022-01-10 ENCOUNTER — Emergency Department (HOSPITAL_BASED_OUTPATIENT_CLINIC_OR_DEPARTMENT_OTHER)
Admission: EM | Admit: 2022-01-10 | Discharge: 2022-01-10 | Disposition: A | Discharge status: Home / Self Care | Payer: 59 | Attending: Emergency Medicine | Admitting: Emergency Medicine

## 2022-01-10 ENCOUNTER — Encounter (HOSPITAL_BASED_OUTPATIENT_CLINIC_OR_DEPARTMENT_OTHER): Payer: Self-pay

## 2022-01-10 DIAGNOSIS — R0789 Other chest pain: Secondary | ICD-10-CM | POA: Diagnosis not present

## 2022-01-10 DIAGNOSIS — R002 Palpitations: Secondary | ICD-10-CM | POA: Insufficient documentation

## 2022-01-10 DIAGNOSIS — Z79899 Other long term (current) drug therapy: Secondary | ICD-10-CM | POA: Insufficient documentation

## 2022-01-10 LAB — CBC
HCT: 41.5 % (ref 36.0–46.0)
Hemoglobin: 13.8 g/dL (ref 12.0–15.0)
MCH: 32.1 pg (ref 26.0–34.0)
MCHC: 33.3 g/dL (ref 30.0–36.0)
MCV: 96.5 fL (ref 80.0–100.0)
Platelets: 254 10*3/uL (ref 150–400)
RBC: 4.3 MIL/uL (ref 3.87–5.11)
RDW: 13.1 % (ref 11.5–15.5)
WBC: 6.7 10*3/uL (ref 4.0–10.5)
nRBC: 0 % (ref 0.0–0.2)

## 2022-01-10 LAB — BASIC METABOLIC PANEL
Anion gap: 7 (ref 5–15)
BUN: 18 mg/dL (ref 6–20)
CO2: 22 mmol/L (ref 22–32)
Calcium: 8.5 mg/dL — ABNORMAL LOW (ref 8.9–10.3)
Chloride: 107 mmol/L (ref 98–111)
Creatinine, Ser: 1.04 mg/dL — ABNORMAL HIGH (ref 0.44–1.00)
GFR, Estimated: >60 mL/min (ref >60)
Glucose, Bld: 93 mg/dL (ref 70–99)
Potassium: 4 mmol/L (ref 3.5–5.1)
Sodium: 136 mmol/L (ref 135–145)

## 2022-01-10 LAB — TROPONIN I (HIGH SENSITIVITY)
Troponin I (High Sensitivity): <2 ng/L (ref <18)
Troponin I (High Sensitivity): <2 ng/L (ref <18)

## 2022-01-10 LAB — D-DIMER, QUANTITATIVE: D-Dimer, Quant: 0.45 ug/mL-FEU (ref 0.00–0.50)

## 2022-01-10 NOTE — Discharge Instructions (Signed)
Follow-up with cardiology as planned tomorrow. ?Your work-up today was reassuring, I think the palpitations are likely related to the premature ventricular contractions on the monitor.  Continue taking your metoprolol, keep notes of when you are having the symptoms and if anything in particular triggers them.   ?

## 2022-01-10 NOTE — ED Triage Notes (Signed)
Pt c/o heart palpitations, supposed to see cardiology tomorrow. C/o chest pain, dizziness, palpitations, fatigue, dyspnea on exertion. ?

## 2022-01-10 NOTE — ED Provider Notes (Signed)
?[facility] ?Provider Note ? ? ?CSN: [id number] ?Arrival date & time: 01/10/22  1432 ? ?  ? ?History ? ?Chief Complaint  ?Patient presents with  ? Chest Pain  ? ? ?Laura Simpson is a 46 y.o. female. ? ? ?Chest Pain ? ?Patient with medical history notable for migraines, hypoglycemia, SVT on metoprolol presents today due to palpitations.  She is having palpitations intermittently for a month and a half, wear Holter monitor which is notable for bundle branch block and SVT per patient.  She is a cardiology appointment tomorrow but starting this morning she started having left-sided chest pain.  It radiates to her arm and she feels "like there is a bursting in her throat".  She feels she dyspnea on exertion recently, no shortness of breath at rest.  No lower extremity swelling, no history of PE or MI.  Does not smoke cigarettes, no family history of MI before 31.  She has not tried anything for the symptoms, the pain/palpitations is intermittent. ? ?Home Medications ?Prior to Admission medications   ?Not on File  ?   ? ?Allergies    ?Patient has no known allergies.   ? ?Review of Systems   ?Review of Systems  ?Cardiovascular:  Positive for chest pain.  ? ?Physical Exam ?Updated Vital Signs ?BP 105/71   Pulse 70   Temp 98 ?F (36.7 ?C) (Oral)   Resp (!) 21   Ht 5\' 11"  (1.803 m)   Wt 118 kg   SpO2 99%   BMI 36.28 kg/m?  ?Physical Exam ?Vitals and nursing note reviewed. Exam conducted with a chaperone present.  ?Constitutional:   ?   Appearance: Normal appearance.  ?HENT:  ?   Head: Normocephalic and atraumatic.  ?Eyes:  ?   General: No scleral icterus.    ?   Right eye: No discharge.     ?   Left eye: No discharge.  ?   Extraocular Movements: Extraocular movements intact.  ?   Pupils: Pupils are equal, round, and reactive to light.  ?Cardiovascular:  ?   Rate and Rhythm: Normal rate and regular rhythm.  ?   Pulses: Normal pulses.  ?   Heart sounds: Normal heart sounds. No murmur heard. ?   No friction rub. No gallop.  ?   Comments: Mostly regular rhythm, patient is having occasional PVCs on the monitor ?Pulmonary:  ?   Effort: Pulmonary effort is normal. No respiratory distress.  ?   Breath sounds: Normal breath sounds.  ?Abdominal:  ?   General: Abdomen is flat. Bowel sounds are normal. There is no distension.  ?   Palpations: Abdomen is soft.  ?   Tenderness: There is no abdominal tenderness.  ?Skin: ?   General: Skin is warm and dry.  ?   Coloration: Skin is not jaundiced.  ?Neurological:  ?   Mental Status: She is alert. Mental status is at baseline.  ?   Coordination: Coordination normal.  ? ? ?ED Results / Procedures / Treatments   ?Labs ?(all labs ordered are listed, but only abnormal results are displayed) ?Labs Reviewed  ?BASIC METABOLIC PANEL - Abnormal; Notable for the following components:  ?    Result Value  ? Creatinine, Ser 1.04 (*)   ? Calcium 8.5 (*)   ? All other components within normal limits  ?CBC  ?D-DIMER, QUANTITATIVE  ?TROPONIN I (HIGH SENSITIVITY)  ?TROPONIN I (HIGH SENSITIVITY)  ? ? ?EKG ?EKG Interpretation ? ?Date/Time:  Wednesday  January 10 2022 14:40:15 EDT ?Ventricular Rate:  79 ?PR Interval:  142 ?QRS Duration: 80 ?QT Interval:  362 ?QTC Calculation: 415 ?R Axis:   44 ?Text Interpretation: Normal sinus rhythm Possible Anterolateral infarct , age undetermined Abnormal ECG When compared with ECG of 27-Jan-2021 12:50, Nonspecific changes since previous Confirmed by Wandra Arthurs [phone]) on 01/10/2022 4:21:39 PM ? ?Radiology ?DG Chest 2 View ? ?Result Date: 01/10/2022 ?CLINICAL DATA:  Chest pain EXAM: CHEST - 2 VIEW COMPARISON:  Chest x-ray 11/13/2017 FINDINGS: Heart size and mediastinal contours are within normal limits. No suspicious pulmonary opacities identified. No pleural effusion or pneumothorax visualized. No acute osseous abnormality appreciated. IMPRESSION: No acute intrathoracic process identified. Electronically Signed   By: Ofilia Neas M.D.   On: 01/10/2022  14:55   ? ?Procedures ?Procedures  ? ? ?Medications Ordered in ED ?Medications - No data to display ? ?ED Course/ Medical Decision Making/ A&P ?  ?                        ?Medical Decision Making ?Amount and/or Complexity of Data Reviewed ?Labs: ordered. ?Radiology: ordered. ? ? ?This patient presents to the ED for concern of chest pain/palpitations this involves an extensive number of treatment options, and is a complaint that carries with it a high risk of complications and morbidity.  The differential diagnosis includes but not limited to PE, ACS, palpitations, costochondritis, electrolyte derangement, costochondritis, esophageal rupture ? ?Patient?s presentation is complicated by their history of SVT presenting in cardiology follow-up this week being on metoprolol. ? ? ?Additional history obtained:  ? ?Independent historian: husband ? ?Reviewed external records, patient was seen by family medicine regarding the SVT.  She is a cardiology appointment tomorrow. ? ?  ?Lab Tests: ? ?I ordered, viewed, and personally interpreted labs.  The pertinent results include:   ? ?No leukocytosis or anemia, no gross electrolyte derangement.  Slight elevation in creatinine at 1.04 but not grossly remarkable.  Dimer is negative, delta troponin is negative. ? ?Imaging Studies ordered: ? ?I directly visualized the chest xray, which showed no acute process ? ?I agree with the radiologist interpretation ?  ? ?ECG/Cardiac monitoring:  ? ?Per my interpretation, EKG shows sinus rhythm.  T wave inversions in lead III that were not present in 2022.  Otherwise unremarkable. ? ?The patient was maintained on a cardiac monitor.  Visualized monitor strip which showed sinus rhythms with frequent PVCs that correlate with her symptoms of palpitations per my interpretation.  ? ? ?Medicines ordered and prescription drug management: ? ?I have reviewed the patients home medicines and have made adjustments as needed ? ? ?Test Considered: ? ?Considered  PE but given patient had a negative dimer I think it is unlikely. ?Heart score is 3, negative delta troponin.  Given the symptoms correlate with her PVCs I think ACS is less likely. ? ? ? ?Reevaluation: ? ?After the interventions noted above, I reevaluated the patient and found patient is resting comfortably.  Still having infrequent PVCs  ? ? ?Problems addressed / ED Course: ?Chest pain/palpitations-I suspect patient's palpitations are secondary to the PVCs and staying on the monitor.  Work-up is overall reassuring which does not show any signs of ACS or PE although I considered those.  No gross electrolyte derangement, she is due to follow-up with cardiology tomorrow and I do not feel she needs observation or admission at this time given low heart score. ?  ?Social Determinants of Health: ?Outpatient  follow up ?  ?Disposition: ? ? ?After consideration of the diagnostic results and the patients response to treatment, I feel that the patent would benefit from cardiology follow up. ? ?  ? ? ? ? ? ? ? ? ?Final Clinical Impression(s) / ED Diagnoses ?Final diagnoses:  ?None  ? ? ?Rx / DC Orders ?ED Discharge Orders   ? ? None  ? ?  ? ? ?  ?Sherrill Raring, PA-C ?01/10/22 2125 ? ?  ?Drenda Freeze, MD ?01/10/22 2320 ? ?

## 2022-11-11 ENCOUNTER — Encounter (HOSPITAL_BASED_OUTPATIENT_CLINIC_OR_DEPARTMENT_OTHER): Payer: Self-pay | Admitting: Emergency Medicine

## 2022-11-11 ENCOUNTER — Other Ambulatory Visit: Payer: Self-pay

## 2022-11-11 ENCOUNTER — Emergency Department (HOSPITAL_BASED_OUTPATIENT_CLINIC_OR_DEPARTMENT_OTHER): Payer: Medicaid Other

## 2022-11-11 ENCOUNTER — Emergency Department (HOSPITAL_BASED_OUTPATIENT_CLINIC_OR_DEPARTMENT_OTHER)
Admission: EM | Admit: 2022-11-11 | Discharge: 2022-11-11 | Disposition: A | Discharge status: Home / Self Care | Payer: Medicaid Other | Attending: Emergency Medicine | Admitting: Emergency Medicine

## 2022-11-11 DIAGNOSIS — R059 Cough, unspecified: Secondary | ICD-10-CM | POA: Diagnosis present

## 2022-11-11 DIAGNOSIS — R000 Tachycardia, unspecified: Secondary | ICD-10-CM | POA: Insufficient documentation

## 2022-11-11 DIAGNOSIS — E871 Hypo-osmolality and hyponatremia: Secondary | ICD-10-CM | POA: Diagnosis not present

## 2022-11-11 DIAGNOSIS — U071 COVID-19: Secondary | ICD-10-CM | POA: Insufficient documentation

## 2022-11-11 DIAGNOSIS — J111 Influenza due to unidentified influenza virus with other respiratory manifestations: Secondary | ICD-10-CM

## 2022-11-11 DIAGNOSIS — J101 Influenza due to other identified influenza virus with other respiratory manifestations: Secondary | ICD-10-CM | POA: Diagnosis not present

## 2022-11-11 LAB — COMPREHENSIVE METABOLIC PANEL
ALT: 21 U/L (ref 0–44)
AST: 28 U/L (ref 15–41)
Albumin: 4 g/dL (ref 3.5–5.0)
Alkaline Phosphatase: 75 U/L (ref 38–126)
Anion gap: 8 (ref 5–15)
BUN: 15 mg/dL (ref 6–20)
CO2: 21 mmol/L — ABNORMAL LOW (ref 22–32)
Calcium: 8.9 mg/dL (ref 8.9–10.3)
Chloride: 102 mmol/L (ref 98–111)
Creatinine, Ser: 1.05 mg/dL — ABNORMAL HIGH (ref 0.44–1.00)
GFR, Estimated: >60 mL/min (ref >60)
Glucose, Bld: 99 mg/dL (ref 70–99)
Potassium: 3.7 mmol/L (ref 3.5–5.1)
Sodium: 131 mmol/L — ABNORMAL LOW (ref 135–145)
Total Bilirubin: 0.5 mg/dL (ref 0.3–1.2)
Total Protein: 7.7 g/dL (ref 6.5–8.1)

## 2022-11-11 LAB — CBC WITH DIFFERENTIAL/PLATELET
Abs Immature Granulocytes: 0.03 10*3/uL (ref 0.00–0.07)
Basophils Absolute: 0.1 10*3/uL (ref 0.0–0.1)
Basophils Relative: 1 %
Eosinophils Absolute: 0.6 10*3/uL — ABNORMAL HIGH (ref 0.0–0.5)
Eosinophils Relative: 8 %
HCT: 42.9 % (ref 36.0–46.0)
Hemoglobin: 14.6 g/dL (ref 12.0–15.0)
Immature Granulocytes: 0 %
Lymphocytes Relative: 5 %
Lymphs Abs: 0.4 10*3/uL — ABNORMAL LOW (ref 0.7–4.0)
MCH: 32.3 pg (ref 26.0–34.0)
MCHC: 34 g/dL (ref 30.0–36.0)
MCV: 94.9 fL (ref 80.0–100.0)
Monocytes Absolute: 0.6 10*3/uL (ref 0.1–1.0)
Monocytes Relative: 8 %
Neutro Abs: 6.4 10*3/uL (ref 1.7–7.7)
Neutrophils Relative %: 78 %
Platelets: 252 10*3/uL (ref 150–400)
RBC: 4.52 MIL/uL (ref 3.87–5.11)
RDW: 12.9 % (ref 11.5–15.5)
WBC: 8.2 10*3/uL (ref 4.0–10.5)
nRBC: 0 % (ref 0.0–0.2)

## 2022-11-11 LAB — URINALYSIS, ROUTINE W REFLEX MICROSCOPIC
Bilirubin Urine: NEGATIVE
Glucose, UA: NEGATIVE mg/dL
Hgb urine dipstick: NEGATIVE
Ketones, ur: NEGATIVE mg/dL
Leukocytes,Ua: NEGATIVE
Nitrite: NEGATIVE
Protein, ur: NEGATIVE mg/dL
Specific Gravity, Urine: 1.015 (ref 1.005–1.030)
pH: 6 (ref 5.0–8.0)

## 2022-11-11 LAB — PREGNANCY, URINE: Preg Test, Ur: NEGATIVE

## 2022-11-11 LAB — RESP PANEL BY RT-PCR (RSV, FLU A&B, COVID)  RVPGX2
Influenza A by PCR: POSITIVE — AB
Influenza B by PCR: NEGATIVE
Resp Syncytial Virus by PCR: NEGATIVE
SARS Coronavirus 2 by RT PCR: POSITIVE — AB

## 2022-11-11 LAB — LACTIC ACID, PLASMA: Lactic Acid, Venous: 1.9 mmol/L (ref 0.5–1.9)

## 2022-11-11 MED ORDER — DOXYCYCLINE HYCLATE 100 MG PO TABS
100.0000 mg | ORAL_TABLET | Freq: Once | ORAL | Status: AC
  Administered 2022-11-11: 100 mg via ORAL
  Filled 2022-11-11: qty 1

## 2022-11-11 MED ORDER — OSELTAMIVIR PHOSPHATE 75 MG PO CAPS: 75.0000 mg | ORAL_CAPSULE | Freq: Two times a day (BID) | ORAL | 0 refills | Status: AC

## 2022-11-11 MED ORDER — SODIUM CHLORIDE 0.9 % IV SOLN
1.0000 g | Freq: Once | INTRAVENOUS | Status: AC
  Administered 2022-11-11: 1 g via INTRAVENOUS
  Filled 2022-11-11: qty 10

## 2022-11-11 MED ORDER — IOHEXOL 350 MG/ML SOLN
80.0000 mL | Freq: Once | INTRAVENOUS | Status: AC | PRN
  Administered 2022-11-11: 80 mL via INTRAVENOUS

## 2022-11-11 MED ORDER — DOXYCYCLINE HYCLATE 100 MG PO CAPS: 100.0000 mg | ORAL_CAPSULE | Freq: Two times a day (BID) | ORAL | 0 refills | Status: AC

## 2022-11-11 MED ORDER — OSELTAMIVIR PHOSPHATE 75 MG PO CAPS
75.0000 mg | ORAL_CAPSULE | Freq: Once | ORAL | Status: AC
  Administered 2022-11-11: 75 mg via ORAL
  Filled 2022-11-11: qty 1

## 2022-11-11 MED ORDER — KETOROLAC TROMETHAMINE 30 MG/ML IJ SOLN
30.0000 mg | Freq: Once | INTRAMUSCULAR | Status: AC
  Administered 2022-11-11: 30 mg via INTRAVENOUS
  Filled 2022-11-11: qty 1

## 2022-11-11 MED ORDER — SODIUM CHLORIDE 0.9 % IV BOLUS
1000.0000 mL | Freq: Once | INTRAVENOUS | Status: AC
  Administered 2022-11-11: 1000 mL via INTRAVENOUS

## 2022-11-11 NOTE — ED Provider Notes (Signed)
[facility] Provider Note   CSN: [id number] Arrival date & time: 11/11/22  1125     History  Chief Complaint  Patient presents with   Cough    Laura Simpson is a 47 y.o. female present emergency department with cough and generalized weakness.  Patient had COVID 2 weeks ago per her report.  She felt that she was getting better but then about to begin feeling worse again 2 days ago, now with muscle aches, headache, body aches, coughing, nausea.  No sick contacts in the house.  She reports he had the flu in November.  HPI     Home Medications Prior to Admission medications   Medication Sig Start Date End Date Taking? Authorizing Provider  doxycycline (VIBRAMYCIN) 100 MG capsule Take 1 capsule (100 mg total) by mouth 2 (two) times daily for 6 days. 11/12/22 11/18/22 Yes Raiana Pharris, Carola Rhine, MD  oseltamivir (TAMIFLU) 75 MG capsule Take 1 capsule (75 mg total) by mouth 2 (two) times daily for 4 days. 11/12/22 11/16/22 Yes Gabrial Poppell, Carola Rhine, MD      Allergies    Patient has no known allergies.    Review of Systems   Review of Systems  Physical Exam Updated Vital Signs BP 119/66   Pulse (!) 125   Temp 98.6 F (37 C) (Oral)   Resp (!) 25   Ht 5' 11"$  (1.803 m)   Wt 122.5 kg   SpO2 96%   BMI 37.66 kg/m  Physical Exam Constitutional:      General: She is not in acute distress. HENT:     Head: Normocephalic and atraumatic.  Eyes:     Conjunctiva/sclera: Conjunctivae normal.     Pupils: Pupils are equal, round, and reactive to light.  Cardiovascular:     Rate and Rhythm: Regular rhythm. Tachycardia present.  Pulmonary:     Effort: Pulmonary effort is normal. No respiratory distress.  Abdominal:     General: There is no distension.     Tenderness: There is no abdominal tenderness.  Skin:    General: Skin is warm and dry.  Neurological:     General: No focal deficit present.     Mental Status: She is alert and oriented to person,  place, and time. Mental status is at baseline.  Psychiatric:        Mood and Affect: Mood normal.        Behavior: Behavior normal.     ED Results / Procedures / Treatments   Labs (all labs ordered are listed, but only abnormal results are displayed) Labs Reviewed  RESP PANEL BY RT-PCR (RSV, FLU A&B, COVID)  RVPGX2 - Abnormal; Notable for the following components:      Result Value   SARS Coronavirus 2 by RT PCR POSITIVE (*)    Influenza A by PCR POSITIVE (*)    All other components within normal limits  COMPREHENSIVE METABOLIC PANEL - Abnormal; Notable for the following components:   Sodium 131 (*)    CO2 21 (*)    Creatinine, Ser 1.05 (*)    All other components within normal limits  CBC WITH DIFFERENTIAL/PLATELET - Abnormal; Notable for the following components:   Lymphs Abs 0.4 (*)    Eosinophils Absolute 0.6 (*)    All other components within normal limits  LACTIC ACID, PLASMA  URINALYSIS, ROUTINE W REFLEX MICROSCOPIC  PREGNANCY, URINE    EKG None  Radiology CT Angio Chest PE W and/or Wo Contrast  Result Date: 11/11/2022 CLINICAL DATA:  Upper respiratory symptoms. Clinical concern for pulmonary embolus. EXAM: CT ANGIOGRAPHY CHEST WITH CONTRAST TECHNIQUE: Multidetector CT imaging of the chest was performed using the standard protocol during bolus administration of intravenous contrast. Multiplanar CT image reconstructions and MIPs were obtained to evaluate the vascular anatomy. RADIATION DOSE REDUCTION: This exam was performed according to the departmental dose-optimization program which includes automated exposure control, adjustment of the mA and/or kV according to patient size and/or use of iterative reconstruction technique. CONTRAST:  54m OMNIPAQUE IOHEXOL 350 MG/ML SOLN COMPARISON:  10/15/2005. FINDINGS: Cardiovascular: The heart size is normal. No substantial pericardial effusion. No thoracic aortic aneurysm. No substantial atherosclerosis of the thoracic aorta. There  is no filling defect within the opacified pulmonary arteries to suggest the presence of an acute pulmonary embolus. Mediastinum/Nodes: No mediastinal lymphadenopathy. There is no hilar lymphadenopathy. The esophagus has normal imaging features. There is no axillary lymphadenopathy. Lungs/Pleura: No suspicious pulmonary nodule or mass. Calcified granuloma noted posterior right upper lobe on image 38 of series 6. No focal airspace consolidation. No pleural effusion. Compressive atelectasis noted at the left lung base, adjacent to the hemidiaphragm. Upper Abdomen: Unremarkable. Musculoskeletal: No worrisome lytic or sclerotic osseous abnormality. Review of the MIP images confirms the above findings. IMPRESSION: 1. No CT evidence for acute pulmonary embolus. 2. Compressive atelectasis at the left lung base, adjacent to the hemidiaphragm. Electronically Signed   By: EMisty StanleyM.D.   On: 11/11/2022 14:01   DG Chest 2 View  Result Date: 11/11/2022 CLINICAL DATA:  Cough EXAM: CHEST - 2 VIEW COMPARISON:  Chest x-ray May 24, 2022 FINDINGS: The cardiomediastinal silhouette is unchanged in contour. There is a focal pulmonary opacity in the left midlung. The right lung is clear. No pleural effusion or pneumothorax. The visualized upper abdomen is unremarkable. No acute osseous abnormality. IMPRESSION: Focal pulmonary opacity in the left midlung. Differential considerations include atelectasis, aspiration, or infection. Electronically Signed   By: MBeryle FlockM.D.   On: 11/11/2022 12:06    Procedures Procedures    Medications Ordered in ED Medications  cefTRIAXone (ROCEPHIN) 1 g in sodium chloride 0.9 % 100 mL IVPB (1 g Intravenous New Bag/Given 11/11/22 1512)  sodium chloride 0.9 % bolus 1,000 mL (0 mLs Intravenous Stopped 11/11/22 1443)  ketorolac (TORADOL) 30 MG/ML injection 30 mg (30 mg Intravenous Given 11/11/22 1328)  iohexol (OMNIPAQUE) 350 MG/ML injection 80 mL (80 mLs Intravenous Contrast Given  11/11/22 1333)  doxycycline (VIBRA-TABS) tablet 100 mg (100 mg Oral Given 11/11/22 1514)  oseltamivir (TAMIFLU) capsule 75 mg (75 mg Oral Given 11/11/22 1513)    ED Course/ Medical Decision Making/ A&P                             Medical Decision Making Amount and/or Complexity of Data Reviewed Labs: ordered. Radiology: ordered. ECG/medicine tests: ordered.  Risk Prescription drug management.   This patient presents to the ED with concern for cough, myalgia, body aches. This involves an extensive number of treatment options, and is a complaint that carries with it a high risk of complications and morbidity.  The differential diagnosis includes bacterial infection versus viral infection versus other  I ordered and personally interpreted labs.  The pertinent results include: COVID test is positive which is likely residual from her positive diagnosis 2 to 3 weeks ago.  I do not believe she is actively infected with COVID.  Her influenza test is  positive and I do believe this is active.  She has no leukocytosis.  Her labs are otherwise unremarkable, aside from very mild hyponatremia.  I ordered imaging studies including x-ray of the chest, CT PE I independently visualized and interpreted imaging which showed atelectasis versus consolidation, no acute PE I agree with the radiologist interpretation  The patient was maintained on a cardiac monitor.  I personally viewed and interpreted the cardiac monitored which showed an underlying rhythm of: Sinus tachycardia  Per my interpretation the patient's ECG shows sinus tachycardia  I ordered medication including Tamiflu for influenza.  Rocephin and doxycycline for potential pneumonia; fluid bolus also ordered as well as Toradol for hydration and pain and for hyponatremia  I have reviewed the patients home medicines and have made adjustments as needed   After the interventions noted above, I reevaluated the patient and found that they have:  improved  Dispostion:  After consideration of the diagnostic results and the patients response to treatment, I feel that the patent would benefit from close outpatient follow-up.  Hospitalization was considered, but given the patient had no evidence of significant dehydration, no tachypnea, no hypoxia or oxygen requirement, I thought it was reasonable also to manage this at home.  She lives with her husband who can help take care of her.  She is noted to have some persistent tachycardia which I suspect is likely pain related and also infection related.  She does not have evidence of hypotension or shock here in the ED.  Of note, a blood culture was drawn by triage staff with initial labs.  I have a low suspicion for sepsis at this time.         Final Clinical Impression(s) / ED Diagnoses Final diagnoses:  Influenza  Tachycardia    Rx / DC Orders ED Discharge Orders          Ordered    oseltamivir (TAMIFLU) 75 MG capsule  2 times daily        11/11/22 1502    doxycycline (VIBRAMYCIN) 100 MG capsule  2 times daily        11/11/22 1502              Wyvonnia Dusky, MD 11/11/22 1534

## 2022-11-11 NOTE — ED Notes (Signed)
1 set blood cultures collected with LAC IV start

## 2022-11-11 NOTE — ED Triage Notes (Signed)
Pt arrives pov, slow gait with c/o covid x 2 weeks pta, relapse of URI symptoms with dizziness starting last night.

## 2022-12-06 ENCOUNTER — Emergency Department (HOSPITAL_BASED_OUTPATIENT_CLINIC_OR_DEPARTMENT_OTHER): Payer: Medicaid Other

## 2022-12-06 ENCOUNTER — Other Ambulatory Visit: Payer: Self-pay

## 2022-12-06 ENCOUNTER — Encounter (HOSPITAL_BASED_OUTPATIENT_CLINIC_OR_DEPARTMENT_OTHER): Payer: Self-pay | Admitting: Emergency Medicine

## 2022-12-06 ENCOUNTER — Emergency Department (HOSPITAL_BASED_OUTPATIENT_CLINIC_OR_DEPARTMENT_OTHER)
Admission: EM | Admit: 2022-12-06 | Discharge: 2022-12-06 | Disposition: A | Discharge status: Home / Self Care | Payer: Medicaid Other | Attending: Emergency Medicine | Admitting: Emergency Medicine

## 2022-12-06 DIAGNOSIS — F419 Anxiety disorder, unspecified: Secondary | ICD-10-CM | POA: Insufficient documentation

## 2022-12-06 DIAGNOSIS — H538 Other visual disturbances: Secondary | ICD-10-CM | POA: Insufficient documentation

## 2022-12-06 DIAGNOSIS — R479 Unspecified speech disturbances: Secondary | ICD-10-CM | POA: Insufficient documentation

## 2022-12-06 DIAGNOSIS — R251 Tremor, unspecified: Secondary | ICD-10-CM | POA: Diagnosis present

## 2022-12-06 NOTE — ED Provider Notes (Signed)
[facility]  Provider Note  CSN: [id number] Arrival date & time: 12/06/22 [id number]  History Chief Complaint  Patient presents with   Visual Field Change   Aphasia    Laura Simpson is a 47 y.o. female with history of TBI/post-concussion syndrome, anxiety, adjustment disorder reports she woke up around 0030hrs tonight with tremors and noted that she had some blurred vision on the right side. She states she was having trouble texting her husband so she called him and was having trouble getting her words out. He encouraged her to try to relax and wait for a while to see if symptoms resolved. Her blurry vision has resolved but she continued to have a tremor and difficulty speaking prompting her to drive herself to the ED for evaluation. She has had similar symptoms in the past with her prior post-concussive syndrome including a similar episode a few weeks ago. She spoke to her PCP about this at a visit on 3/5 (note is not available in [place] but an MRI/MRA brain was ordered). 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    Home Medications Prior to Admission medications   Not on File     Allergies    Patient has no known allergies.   Review of Systems   Review of Systems Please see HPI for pertinent positives and negatives  Physical Exam BP 105/76 (BP Location: Right Arm)   Pulse (!) 101   Temp 97.6 F (36.4 C) (Oral)   Resp 20   Ht '5\' 11"'$  (1.803 m)   Wt 122.5 kg   SpO2 99%   BMI 37.67 kg/m   Physical Exam Vitals and nursing note reviewed.  Constitutional:      Appearance: Normal appearance.  HENT:     Head: Normocephalic and atraumatic.     Nose: Nose normal.     Mouth/Throat:     Mouth: Mucous membranes are moist.  Eyes:     Extraocular Movements: Extraocular movements intact.     Conjunctiva/sclera: Conjunctivae normal.     Pupils: Pupils are equal,  round, and reactive to light.     Comments: Grossly normal visual acuity  Cardiovascular:     Rate and Rhythm: Normal rate.  Pulmonary:     Effort: Pulmonary effort is normal.     Breath sounds: Normal breath sounds.  Abdominal:     General: Abdomen is flat.     Palpations: Abdomen is soft.     Tenderness: There is no abdominal tenderness.  Musculoskeletal:        General: No swelling. Normal range of motion.     Cervical back: Neck supple.  Skin:    General: Skin is warm and dry.  Neurological:     General: No focal deficit present.     Mental Status: She is alert and oriented to person, place, and time.     Cranial Nerves: No cranial nerve deficit.     Sensory: No sensory deficit.     Motor: No weakness.     Coordination: Coordination normal.     Comments: Speech is halting, but fluent, no dysarthria or aphasia; mild tremor at rest extinguishes with distraction, no intention tremor; no visual field cuts  Psychiatric:     Comments: anxious     ED Results / Procedures / Treatments   EKG None  Procedures Procedures  Medications Ordered in the ED Medications -  No data to display  Initial Impression and Plan  Patient here with reports of blurry vision and speech disturbance. She has no focal neuro deficits on exam. I suspect her symptoms are more due to stress and anxiety or less likely a consequence of her head injury in 2019. She was offered ED workup including CT head and labs to offer her reassurance. She would like to proceed with CT but will forego labs today. I expect that she will need further outpatient management with PCP and Psychiatry. She also indicates she has requested a referral to a Neurologist.   ED Course   Clinical Course as of 12/06/22 0626  Thu Dec 06, 2022  0621 I personally viewed the images from radiology studies and agree with radiologist interpretation: CT is neg for acute intracranial process. Patient reassured. Recommend continued outpatient  management with PCP. RTED for any other concerns.   [CS]    Clinical Course User Index [CS] Truddie Hidden, MD     MDM Rules/Calculators/A&P Medical Decision Making Problems Addressed: Blurry vision: acute illness or injury Tremor: acute illness or injury  Amount and/or Complexity of Data Reviewed Radiology: ordered and independent interpretation performed. Decision-making details documented in ED Course.     Final Clinical Impression(s) / ED Diagnoses Final diagnoses:  Tremor  Blurry vision    Rx / DC Orders ED Discharge Orders     None        Truddie Hidden, MD 12/06/22 [phone]

## 2022-12-06 NOTE — ED Triage Notes (Signed)
Pt states right side blurred vision, difficulty speaking and confusion since 00:30 today. Pt states had Hx of tremors and TBI with post concussion syndrome.

## 2022-12-06 NOTE — ED Notes (Signed)
Patient transported to CT 

## 2023-02-05 ENCOUNTER — Emergency Department (HOSPITAL_BASED_OUTPATIENT_CLINIC_OR_DEPARTMENT_OTHER)
Admission: EM | Admit: 2023-02-05 | Discharge: 2023-02-05 | Disposition: A | Discharge status: Home / Self Care | Payer: Medicaid Other | Attending: Emergency Medicine | Admitting: Emergency Medicine

## 2023-02-05 ENCOUNTER — Encounter (HOSPITAL_BASED_OUTPATIENT_CLINIC_OR_DEPARTMENT_OTHER): Payer: Self-pay | Admitting: Radiology

## 2023-02-05 ENCOUNTER — Emergency Department (HOSPITAL_BASED_OUTPATIENT_CLINIC_OR_DEPARTMENT_OTHER): Payer: Medicaid Other

## 2023-02-05 ENCOUNTER — Other Ambulatory Visit: Payer: Self-pay

## 2023-02-05 DIAGNOSIS — R4789 Other speech disturbances: Secondary | ICD-10-CM

## 2023-02-05 DIAGNOSIS — R531 Weakness: Secondary | ICD-10-CM | POA: Insufficient documentation

## 2023-02-05 DIAGNOSIS — R471 Dysarthria and anarthria: Secondary | ICD-10-CM | POA: Insufficient documentation

## 2023-02-05 DIAGNOSIS — R262 Difficulty in walking, not elsewhere classified: Secondary | ICD-10-CM | POA: Diagnosis not present

## 2023-02-05 DIAGNOSIS — Z79899 Other long term (current) drug therapy: Secondary | ICD-10-CM | POA: Diagnosis not present

## 2023-02-05 DIAGNOSIS — R479 Unspecified speech disturbances: Secondary | ICD-10-CM | POA: Insufficient documentation

## 2023-02-05 DIAGNOSIS — R519 Headache, unspecified: Secondary | ICD-10-CM | POA: Diagnosis present

## 2023-02-05 DIAGNOSIS — R202 Paresthesia of skin: Secondary | ICD-10-CM | POA: Insufficient documentation

## 2023-02-05 DIAGNOSIS — R269 Unspecified abnormalities of gait and mobility: Secondary | ICD-10-CM | POA: Diagnosis not present

## 2023-02-05 LAB — DIFFERENTIAL
Abs Immature Granulocytes: 0.03 10*3/uL (ref 0.00–0.07)
Basophils Absolute: 0.1 10*3/uL (ref 0.0–0.1)
Basophils Relative: 1 %
Eosinophils Absolute: 0.5 10*3/uL (ref 0.0–0.5)
Eosinophils Relative: 7 %
Immature Granulocytes: 0 %
Lymphocytes Relative: 26 %
Lymphs Abs: 1.8 10*3/uL (ref 0.7–4.0)
Monocytes Absolute: 0.5 10*3/uL (ref 0.1–1.0)
Monocytes Relative: 7 %
Neutro Abs: 4.1 10*3/uL (ref 1.7–7.7)
Neutrophils Relative %: 59 %

## 2023-02-05 LAB — RAPID URINE DRUG SCREEN, HOSP PERFORMED
Amphetamines: NOT DETECTED
Barbiturates: NOT DETECTED
Benzodiazepines: POSITIVE — AB
Cocaine: NOT DETECTED
Opiates: NOT DETECTED
Tetrahydrocannabinol: NOT DETECTED

## 2023-02-05 LAB — URINALYSIS, ROUTINE W REFLEX MICROSCOPIC
Glucose, UA: NEGATIVE mg/dL
Hgb urine dipstick: NEGATIVE
Ketones, ur: NEGATIVE mg/dL
Leukocytes,Ua: NEGATIVE
Nitrite: NEGATIVE
Protein, ur: 30 mg/dL — AB
Specific Gravity, Urine: >=1.03 (ref 1.005–1.030)
pH: 6 (ref 5.0–8.0)

## 2023-02-05 LAB — URINALYSIS, MICROSCOPIC (REFLEX)

## 2023-02-05 LAB — COMPREHENSIVE METABOLIC PANEL
ALT: 24 U/L (ref 0–44)
AST: 28 U/L (ref 15–41)
Albumin: 3.8 g/dL (ref 3.5–5.0)
Alkaline Phosphatase: 70 U/L (ref 38–126)
Anion gap: 8 (ref 5–15)
BUN: 12 mg/dL (ref 6–20)
CO2: 22 mmol/L (ref 22–32)
Calcium: 8.9 mg/dL (ref 8.9–10.3)
Chloride: 107 mmol/L (ref 98–111)
Creatinine, Ser: 0.93 mg/dL (ref 0.44–1.00)
GFR, Estimated: >60 mL/min (ref >60)
Glucose, Bld: 103 mg/dL — ABNORMAL HIGH (ref 70–99)
Potassium: 3.7 mmol/L (ref 3.5–5.1)
Sodium: 137 mmol/L (ref 135–145)
Total Bilirubin: 0.3 mg/dL (ref 0.3–1.2)
Total Protein: 7.1 g/dL (ref 6.5–8.1)

## 2023-02-05 LAB — CBG MONITORING, ED: Glucose-Capillary: 112 mg/dL — ABNORMAL HIGH (ref 70–99)

## 2023-02-05 LAB — CBC
HCT: 43.3 % (ref 36.0–46.0)
Hemoglobin: 14.8 g/dL (ref 12.0–15.0)
MCH: 31.5 pg (ref 26.0–34.0)
MCHC: 34.2 g/dL (ref 30.0–36.0)
MCV: 92.1 fL (ref 80.0–100.0)
Platelets: 283 10*3/uL (ref 150–400)
RBC: 4.7 MIL/uL (ref 3.87–5.11)
RDW: 12.6 % (ref 11.5–15.5)
WBC: 7 10*3/uL (ref 4.0–10.5)
nRBC: 0 % (ref 0.0–0.2)

## 2023-02-05 LAB — ETHANOL: Alcohol, Ethyl (B): <10 mg/dL (ref <10)

## 2023-02-05 MED ORDER — IOHEXOL 350 MG/ML SOLN
100.0000 mL | Freq: Once | INTRAVENOUS | Status: AC | PRN
  Administered 2023-02-05: 100 mL via INTRAVENOUS

## 2023-02-05 MED ORDER — METOCLOPRAMIDE HCL 5 MG/ML IJ SOLN
10.0000 mg | Freq: Once | INTRAMUSCULAR | Status: AC
  Administered 2023-02-05: 10 mg via INTRAVENOUS
  Filled 2023-02-05: qty 2

## 2023-02-05 MED ORDER — KETOROLAC TROMETHAMINE 15 MG/ML IJ SOLN
15.0000 mg | Freq: Once | INTRAMUSCULAR | Status: AC
  Administered 2023-02-05: 15 mg via INTRAVENOUS
  Filled 2023-02-05: qty 1

## 2023-02-05 MED ORDER — DIPHENHYDRAMINE HCL 50 MG/ML IJ SOLN
25.0000 mg | Freq: Once | INTRAMUSCULAR | Status: AC
  Administered 2023-02-05: 25 mg via INTRAVENOUS
  Filled 2023-02-05: qty 1

## 2023-02-05 MED ORDER — SODIUM CHLORIDE 0.9 % IV BOLUS
500.0000 mL | Freq: Once | INTRAVENOUS | Status: AC
  Administered 2023-02-05: 500 mL via INTRAVENOUS

## 2023-02-05 NOTE — ED Notes (Signed)
BG 112, glucometer not transferring result

## 2023-02-05 NOTE — Discharge Instructions (Signed)
Please read and follow all provided instructions.  Your diagnoses today include:  1. Acute nonintractable headache, unspecified headache type   2. Gait abnormality   3. Episode of change in speech     Tests performed today include: CT of your head which was normal and did not show any serious cause of your headache CT angiography of the neck which did not show any signs of blocked or narrowed blood vessels in the neck Blood cell counts and electrolytes, no concerning findings on your blood workup today Vital signs. See below for your results today.   Medications:  In the Emergency Department you received: Reglan - antinausea/headache medication Benadryl - antihistamine to counteract potential side effects of reglan Toradol - NSAID medication similar to ibuprofen  Take any prescribed medications only as directed.  Additional information:  Follow any educational materials contained in this packet.  You are having a headache. No specific cause was found today for your headache. It may have been a migraine or other cause of headache. Stress, anxiety, fatigue, and depression are common triggers for headaches.   Your headache today does not appear to be life-threatening or require hospitalization, but often the exact cause of headaches is not determined in the emergency department. Therefore, follow-up with your doctor is very important to find out what may have caused your headache and whether or not you need any further diagnostic testing or treatment.   Sometimes headaches can appear benign (not harmful), but then more serious symptoms can develop which should prompt an immediate re-evaluation by your doctor or the emergency department.  BE VERY CAREFUL not to take multiple medicines containing Tylenol (also called acetaminophen). Doing so can lead to an overdose which can damage your liver and cause liver failure and possibly death.   Follow-up instructions: Please call your  neurologist today and let them know about your symptoms and need for follow-up.  Please follow-up with your primary care doctor in the next 48 hours for recheck.  Return instructions:  Please return to the Emergency Department if you experience worsening symptoms. Return if the medications do not resolve your headache, if it recurs, or if you have multiple episodes of vomiting or cannot keep down fluids. Return if you have a change from the usual headache. RETURN IMMEDIATELY IF you: Develop a sudden, severe headache Develop confusion or become poorly responsive or faint Develop a fever above 100.41F or problem breathing Have a change in speech, vision, swallowing, or understanding Develop new weakness, numbness, tingling, incoordination in your arms or legs Have a seizure Please return if you have any other emergent concerns.  Additional Information:  Your vital signs today were: BP 100/72   Pulse 73   Temp 97.6 F (36.4 C)   Resp 12   Ht 5\' 11"  (1.803 m)   Wt 122.5 kg   SpO2 97%   BMI 37.67 kg/m  If your blood pressure (BP) was elevated above 135/85 this visit, please have this repeated by your doctor within one month. --------------

## 2023-02-05 NOTE — ED Provider Notes (Signed)
[place] [facility] AT [facility] Provider Note   CSN: [id number] Arrival date & time: 02/05/23  7829     History  Chief Complaint  Patient presents with   Multiple Complaints     Laura Simpson is a 47 y.o. female.  Patient presents to the emergency department today for evaluation of headache, speech changes, difficulty with ambulation, speech difficulty.  Symptoms started about 4 days ago.  Patient has a history of ED visit in March 2024 for blurry vision/R eye vision loss and tremor.  She had a CT head at that time which was negative.  She followed up with a neurologist in the [facility] system and had an MRI/MRA done.  Results below.  It does not appear that she had carotid Dopplers performed although these were ordered by her neurologist.  Symptoms were thought to be due to migraine per neurology note.  However they wanted to do appropriate stroke workup as well.  Patient reports that she was in [place].  She developed a severe headache and associated symptoms.  She has had some numbness of her left upper extremity and left lower extremity.  Her speech is also dysarthric.  She has been having difficulty with walking.  Per her and her family members report, she has been taking small steps and leaning forward when she walks.  These symptoms are not usual for her.  The numbness improved but then reoccurred earlier today on the right side before resolving.  She endorses generalized weakness, no focal weakness.  No infectious type symptoms reported.  She tried taking sumatriptan but this didn't help.      MRI 12/24/2022:  FINDINGS:  MRI HEAD FINDINGS   Brain: No acute infarction, hemorrhage, hydrocephalus, extra-axial  collection or mass lesion. A few small T2/FLAIR hyperintensities  within the white matter are nonspecific, but considered within  normal limits for patient age.   Vascular: Detailed below.   Skull and upper cervical spine: Normal marrow signal.    Sinuses/Orbits: Mild-to-moderate paranasal sinus mucosal thickening.  No acute orbital findings.   Other: No mastoid effusions.   MRA HEAD FINDINGS   Anterior circulation: Bilateral intracranial ICAs, MCAs, and ACAs  are patent without proximal atypically severe stenosis.   Posterior circulation: Bilateral intradural vertebral arteries,  basilar artery and bilateral posterior cerebral arteries are patent  without proximal adynamic Leigh severe stenosis.         Home Medications Prior to Admission medications   Not on File      Allergies    Patient has no known allergies.    Review of Systems   Review of Systems  Physical Exam Updated Vital Signs BP 106/67 (BP Location: Right Arm)   Pulse (!) 118   Temp 97.6 F (36.4 C) (Oral)   Resp (!) 22   Ht 5\' 11"  (1.803 m)   Wt 122.5 kg   SpO2 96%   BMI 37.67 kg/m  Physical Exam Vitals and nursing note reviewed.  Constitutional:      Appearance: She is well-developed.  HENT:     Head: Normocephalic and atraumatic.     Right Ear: Tympanic membrane, ear canal and external ear normal.     Left Ear: Tympanic membrane, ear canal and external ear normal.     Nose: Nose normal.     Mouth/Throat:     Pharynx: Uvula midline.  Eyes:     General: Lids are normal.     Extraocular Movements:     Right  eye: No nystagmus.     Left eye: No nystagmus.     Conjunctiva/sclera: Conjunctivae normal.     Pupils: Pupils are equal, round, and reactive to light.  Cardiovascular:     Rate and Rhythm: Normal rate and regular rhythm.  Pulmonary:     Effort: Pulmonary effort is normal.     Breath sounds: Normal breath sounds.  Abdominal:     Palpations: Abdomen is soft.     Tenderness: There is no abdominal tenderness.  Musculoskeletal:     Cervical back: Normal range of motion and neck supple. No tenderness or bony tenderness.  Skin:    General: Skin is warm and dry.  Neurological:     Mental Status: She is alert and oriented to  person, place, and time.     GCS: GCS eye subscore is 4. GCS verbal subscore is 5. GCS motor subscore is 6.     Cranial Nerves: Dysarthria (not pronouncing R's) present. No cranial nerve deficit or facial asymmetry.     Sensory: No sensory deficit.     Motor: No weakness.     Coordination: Coordination normal.     Gait: Gait abnormal.     Comments: Upper extremity myotomes tested bilaterally:  C5 Shoulder abduction 5/5 C6 Elbow flexion/wrist extension 5/5 C7 Elbow extension 5/5 C8 Finger flexion 5/5 T1 Finger abduction 5/5  Lower extremity myotomes tested bilaterally: L2 Hip flexion 5/5 L3 Knee extension 5/5 L4 Ankle dorsiflexion 5/5 S1 Ankle plantar flexion 5/5  Patient was assisted out of bed.  She was able to stand without assistance.  She ambulated hunched forward taking very small steps.  She was able to turn without difficulty and walked back to the bed.  She did not require assistance and did not appear out of balance.     ED Results / Procedures / Treatments   Labs (all labs ordered are listed, but only abnormal results are displayed) Labs Reviewed  COMPREHENSIVE METABOLIC PANEL - Abnormal; Notable for the following components:      Result Value   Glucose, Bld 103 (*)    All other components within normal limits  RAPID URINE DRUG SCREEN, HOSP PERFORMED - Abnormal; Notable for the following components:   Benzodiazepines POSITIVE (*)    All other components within normal limits  URINALYSIS, ROUTINE W REFLEX MICROSCOPIC - Abnormal; Notable for the following components:   Bilirubin Urine SMALL (*)    Protein, ur 30 (*)    All other components within normal limits  URINALYSIS, MICROSCOPIC (REFLEX) - Abnormal; Notable for the following components:   Bacteria, UA FEW (*)    All other components within normal limits  CBG MONITORING, ED - Abnormal; Notable for the following components:   Glucose-Capillary 112 (*)    All other components within normal limits  CBC   DIFFERENTIAL  ETHANOL    EKG EKG Interpretation  Date/Time:  Tuesday Feb 05 2023 09:18:22 EDT Ventricular Rate:  102 PR Interval:  136 QRS Duration: 78 QT Interval:  335 QTC Calculation: 437 R Axis:   64 Text Interpretation: Sinus tachycardia No significant change since last tracing Confirmed by Gwyneth Sprout ([id number]) on 02/05/2023 9:50:58 AM  Radiology CT Angio Neck W and/or Wo Contrast  Result Date: 02/05/2023 CLINICAL DATA:  Stroke, follow-up. EXAM: CT ANGIOGRAPHY NECK TECHNIQUE: Multidetector CT imaging of the neck was performed using the standard protocol during bolus administration of intravenous contrast. Multiplanar CT image reconstructions and MIPs were obtained to evaluate the vascular anatomy. Carotid  stenosis measurements (when applicable) are obtained utilizing NASCET criteria, using the distal internal carotid diameter as the denominator. RADIATION DOSE REDUCTION: This exam was performed according to the departmental dose-optimization program which includes automated exposure control, adjustment of the mA and/or kV according to patient size and/or use of iterative reconstruction technique. CONTRAST:  OMNIPAQUE IOHEXOL 350 MG/ML SOLN COMPARISON:  Head CT 02/05/2023. FINDINGS: Aortic arch: Two-vessel arch configuration with common origin of the right brachiocephalic and left common carotid arteries. Arch vessel origins are patent. Right carotid system: No evidence of dissection, stenosis (50% or greater) or occlusion. Left carotid system: No evidence of dissection, stenosis (50% or greater) or occlusion. Vertebral arteries: Codominant. No evidence of dissection, stenosis (50% or greater) or occlusion. Skeleton: No suspicious bone lesions. Other neck: Mild mucosal disease in the left maxillary and bilateral sphenoid sinuses. Upper chest: Unremarkable. IMPRESSION: No evidence of dissection or hemodynamically significant stenosis in the neck. Electronically Signed   By: Orvan Falconer M.D.   On: 02/05/2023 11:22   CT HEAD WO CONTRAST  Result Date: 02/05/2023 CLINICAL DATA:  Headache and dizziness, weakness, visual disturbance EXAM: CT HEAD WITHOUT CONTRAST TECHNIQUE: Contiguous axial images were obtained from the base of the skull through the vertex without intravenous contrast. RADIATION DOSE REDUCTION: This exam was performed according to the departmental dose-optimization program which includes automated exposure control, adjustment of the mA and/or kV according to patient size and/or use of iterative reconstruction technique. COMPARISON:  12/06/2022 FINDINGS: Brain: No evidence of acute infarction, hemorrhage, hydrocephalus, extra-axial collection or mass lesion/mass effect. Vascular: No hyperdense vessel or unexpected calcification. Skull: Normal. Negative for fracture or focal lesion. Sinuses/Orbits: No acute finding. Other: None. IMPRESSION: No acute intracranial pathology. Electronically Signed   By: Judie Petit.  Shick M.D.   On: 02/05/2023 10:40    Procedures Procedures    Medications Ordered in ED Medications  metoCLOPramide (REGLAN) injection 10 mg (has no administration in time range)  diphenhydrAMINE (BENADRYL) injection 25 mg (has no administration in time range)  sodium chloride 0.9 % bolus 500 mL (has no administration in time range)    ED Course/ Medical Decision Making/ A&P    Patient seen and examined. History obtained directly from patient.  I also reviewed previous ED notes, cardiology and neurology outpatient workup and clinic notes.  Labs/EKG: Ordered ED stroke workup order set including CBC, CMP, ethanol per order set, UA, UDS.  Imaging: Ordered CT head, CTA neck.  Medications/Fluids: Ordered: Reglan, Benadryl, fluid bolus  Most recent vital signs reviewed and are as follows: BP 106/67 (BP Location: Right Arm)   Pulse (!) 118   Temp 97.6 F (36.4 C) (Oral)   Resp (!) 22   Ht 5\' 11"  (1.803 m)   Wt 122.5 kg   SpO2 96%   BMI 37.67 kg/m    Initial impression: Neuro symptoms, associated headache    Reassessment performed. Patient appears stable.  Speech unchanged.  She states that her headache is slightly improved but not resolved.  Labs personally reviewed and interpreted including: CBC unremarkable; CMP unremarkable; ethanol negative; UA concentrated but not compelling for infection; UDS positive for benzodiazepine otherwise negative.  Imaging personally visualized and interpreted including: CT head, agree negative, CTA of the neck without any vascular findings.  Reviewed pertinent lab work and imaging with patient at bedside. Questions answered.   Most current vital signs reviewed and are as follows: BP 100/72   Pulse 73   Temp 97.6 F (36.4 C)   Resp 12  Ht 5\' 11"  (1.803 m)   Wt 122.5 kg   SpO2 97%   BMI 37.67 kg/m   Plan: Patient discussed with Dr. Anitra Lauth.  Will speak with neurology by telephone regarding findings and see if they have recommendations for additional workup.  I consulted with Dr. Selina Cooley of neurology and discussed recent history, recent workup and physical exam findings today.  Agree that her current symptoms are low suspicion for stroke and agrees that she would not likely benefit from transfer for MRI today.  Patient has appropriate outpatient neurology follow-up.    2:14 PM Reassessment performed. Patient appears stable. Will give toradol for residual headache.   Reviewed pertinent lab work and imaging with patient at bedside. Questions answered.   Most current vital signs reviewed and are as follows: BP 100/72   Pulse 73   Temp 97.6 F (36.4 C)   Resp 12   Ht 5\' 11"  (1.803 m)   Wt 122.5 kg   SpO2 97%   BMI 37.67 kg/m   Plan: Discharge to home.   Prescriptions written for: None  Other home care instructions discussed: Rest, hydration, OTC meds for headache  ED return instructions discussed: Patient counseled to return if they have weakness in their arms or legs, slurred  speech, trouble walking or talking, confusion, trouble with their balance, or if they have any other concerns. Patient verbalizes understanding and agrees with plan.   Follow-up instructions discussed: Patient encouraged to follow-up with their PCP in 2 days.  Encourage patient to call her neurologist today to schedule outpatient follow-up.                            Medical Decision Making Amount and/or Complexity of Data Reviewed Labs: ordered. Radiology: ordered.  Risk Prescription drug management.   Patient with headache and associated neurosymptoms.  No code stroke as symptoms started several days ago.  Symptoms are very atypical for stroke today.  Patient has had extensive recent workup including MRI.  Her workups have been negative this point.  Considered possibility of complex migraine, however gait abnormality and speech abnormality not resolved treatment today.  CT of the neck was reassuring and no signs of bleeding on CT head.  In regards to the patient's headache, critical differentials were considered including subarachnoid hemorrhage, intracerebral hemorrhage, epidural/subdural hematoma, pituitary apoplexy, vertebral/carotid artery dissection, giant cell arteritis, central venous thrombosis, reversible cerebral vasoconstriction, acute angle closure glaucoma, idiopathic intracranial hypertension, bacterial meningitis, viral encephalitis, carbon monoxide poisoning, posterior reversible encephalopathy syndrome, pre-eclampsia.   Reg flag symptoms related to these causes were considered including systemic symptoms (fever, weight loss), neurologic symptoms (confusion, mental status change, vision change, associated seizure), acute or sudden "thunderclap" onset, patient age 49 or older with new or progressive headache, patient of any age with first headache or change in headache pattern, pregnant or postpartum status, history of HIV or other immunocompromise, history of cancer, headache  occurring with exertion, associated neck or shoulder pain, associated traumatic injury, concurrent use of anticoagulation, family history of spontaneous SAH, and concurrent drug use.    Other benign, more common causes of headache were considered including migraine, tension-type headache, cluster headache, referred pain from other cause such as sinus infection, dental pain, trigeminal neuralgia.   The patient's vital signs, pertinent lab work and imaging were reviewed and interpreted as discussed in the ED course. Hospitalization was considered for further testing, treatments, or serial exams/observation. However as patient is well-appearing, has  a stable exam over the course of their evaluation, and reassuring studies today, I do not feel that they warrant admission at this time. This plan was discussed with the patient who verbalizes agreement and comfort with this plan and seems reliable and able to return to the Emergency Department with worsening or changing symptoms.          Final Clinical Impression(s) / ED Diagnoses Final diagnoses:  Acute nonintractable headache, unspecified headache type  Gait abnormality  Episode of change in speech    Rx / DC Orders ED Discharge Orders     None         Renne Crigler, PA-C 02/05/23 1417    Gwyneth Sprout, MD 02/09/23 [phone]

## 2023-02-05 NOTE — ED Triage Notes (Signed)
Pt states feels like she may have had a stoke on Saturday  States speech problems that started on Saturday, states head ache "feels like ice pick in left side of head", numbness to left thigh.  Pt states blurry vision as well that is intermittent  Also  reports leaning forward with walking and "not taking regular steps"   No h/o stoke, h/o migraines

## 2023-04-03 ENCOUNTER — Emergency Department (HOSPITAL_BASED_OUTPATIENT_CLINIC_OR_DEPARTMENT_OTHER): Payer: Medicaid Other

## 2023-04-03 ENCOUNTER — Other Ambulatory Visit: Payer: Self-pay

## 2023-04-03 ENCOUNTER — Encounter (HOSPITAL_BASED_OUTPATIENT_CLINIC_OR_DEPARTMENT_OTHER): Payer: Self-pay | Admitting: Emergency Medicine

## 2023-04-03 ENCOUNTER — Emergency Department (HOSPITAL_BASED_OUTPATIENT_CLINIC_OR_DEPARTMENT_OTHER)
Admission: EM | Admit: 2023-04-03 | Discharge: 2023-04-04 | Disposition: A | Discharge status: Home / Self Care | Payer: Medicaid Other | Attending: Emergency Medicine | Admitting: Emergency Medicine

## 2023-04-03 DIAGNOSIS — R000 Tachycardia, unspecified: Secondary | ICD-10-CM | POA: Insufficient documentation

## 2023-04-03 DIAGNOSIS — R519 Headache, unspecified: Secondary | ICD-10-CM | POA: Diagnosis not present

## 2023-04-03 DIAGNOSIS — R42 Dizziness and giddiness: Secondary | ICD-10-CM | POA: Diagnosis present

## 2023-04-03 DIAGNOSIS — R251 Tremor, unspecified: Secondary | ICD-10-CM | POA: Insufficient documentation

## 2023-04-03 DIAGNOSIS — R479 Unspecified speech disturbances: Secondary | ICD-10-CM | POA: Insufficient documentation

## 2023-04-03 LAB — CBC
HCT: 45.2 % (ref 36.0–46.0)
Hemoglobin: 15.1 g/dL — ABNORMAL HIGH (ref 12.0–15.0)
MCH: 30.9 pg (ref 26.0–34.0)
MCHC: 33.4 g/dL (ref 30.0–36.0)
MCV: 92.6 fL (ref 80.0–100.0)
Platelets: 271 10*3/uL (ref 150–400)
RBC: 4.88 MIL/uL (ref 3.87–5.11)
RDW: 13.3 % (ref 11.5–15.5)
WBC: 10 10*3/uL (ref 4.0–10.5)
nRBC: 0 % (ref 0.0–0.2)

## 2023-04-03 LAB — COMPREHENSIVE METABOLIC PANEL
ALT: 44 U/L (ref 0–44)
AST: 40 U/L (ref 15–41)
Albumin: 4.2 g/dL (ref 3.5–5.0)
Alkaline Phosphatase: 101 U/L (ref 38–126)
Anion gap: 12 (ref 5–15)
BUN: 14 mg/dL (ref 6–20)
CO2: 22 mmol/L (ref 22–32)
Calcium: 9.4 mg/dL (ref 8.9–10.3)
Chloride: 103 mmol/L (ref 98–111)
Creatinine, Ser: 1 mg/dL (ref 0.44–1.00)
GFR, Estimated: >60 mL/min (ref >60)
Glucose, Bld: 115 mg/dL — ABNORMAL HIGH (ref 70–99)
Potassium: 4.1 mmol/L (ref 3.5–5.1)
Sodium: 137 mmol/L (ref 135–145)
Total Bilirubin: 0.4 mg/dL (ref 0.3–1.2)
Total Protein: 8 g/dL (ref 6.5–8.1)

## 2023-04-03 LAB — DIFFERENTIAL
Abs Immature Granulocytes: 0.06 10*3/uL (ref 0.00–0.07)
Basophils Absolute: 0.1 10*3/uL (ref 0.0–0.1)
Basophils Relative: 1 %
Eosinophils Absolute: 0.7 10*3/uL — ABNORMAL HIGH (ref 0.0–0.5)
Eosinophils Relative: 7 %
Immature Granulocytes: 1 %
Lymphocytes Relative: 34 %
Lymphs Abs: 3.4 10*3/uL (ref 0.7–4.0)
Monocytes Absolute: 0.7 10*3/uL (ref 0.1–1.0)
Monocytes Relative: 7 %
Neutro Abs: 5.1 10*3/uL (ref 1.7–7.7)
Neutrophils Relative %: 50 %

## 2023-04-03 LAB — CBG MONITORING, ED: Glucose-Capillary: 119 mg/dL — ABNORMAL HIGH (ref 70–99)

## 2023-04-03 LAB — MAGNESIUM: Magnesium: 2.1 mg/dL (ref 1.7–2.4)

## 2023-04-03 LAB — ETHANOL: Alcohol, Ethyl (B): <10 mg/dL (ref <10)

## 2023-04-03 MED ORDER — PROCHLORPERAZINE EDISYLATE 10 MG/2ML IJ SOLN
10.0000 mg | Freq: Once | INTRAMUSCULAR | Status: AC
  Administered 2023-04-03: 10 mg via INTRAVENOUS
  Filled 2023-04-03: qty 2

## 2023-04-03 MED ORDER — KETOROLAC TROMETHAMINE 30 MG/ML IJ SOLN
30.0000 mg | Freq: Once | INTRAMUSCULAR | Status: AC
  Administered 2023-04-03: 30 mg via INTRAVENOUS
  Filled 2023-04-03: qty 1

## 2023-04-03 MED ORDER — SODIUM CHLORIDE 0.9 % IV BOLUS
1000.0000 mL | Freq: Once | INTRAVENOUS | Status: AC
  Administered 2023-04-03: 1000 mL via INTRAVENOUS

## 2023-04-03 NOTE — ED Provider Notes (Signed)
[place] [facility] Provider Note   CSN: [id number] Arrival date & time: 04/03/23  2122     History  Chief Complaint  Patient presents with   Tremors    Laura Simpson is a 47 y.o. female presented to ED with a constellation of symptoms.  Patient reports that she began to feel lightheaded today, and associated lightheadedness she had tunnel vision, and then she began having tremors in her arms and legs, as well as difficulty with speech, and a headache.  The patient reports that it began with a right-sided headache behind her right eye, which is improved, then the remainder of her symptoms began.  She also describes a warm, flushed sensation in her chest, "like I just got a big dose of IV contrast".  My review of medical records the patient has been evaluated by family medicine as well as behavioral health at [facility].  She has reported history of major depressive disorder, cognitive disorder, speech disturbance, intractable headaches.  She had a CT scan of her head as well as CT angio of the head and neck performed in May of this year per my review of the medical records, with no emergent findings.  She was also seen by neurologist 12/18/22 for visual disturbance, brain fog, and at that time was reported to have an episode of vision loss and difficulty getting out words lasting 15 to 25 minutes and trouble talking which lasted 2 hours.  Her neurologist felt this is most consistent with a migraine, but pursued MR imaging of the brain with and without contrast as well as MRA which showed no evidence of TIA or significant vascular occlusion.  Patient also reports that she has resting sinus tachycardia is typically on metoprolol for this.  HPI     Home Medications Prior to Admission medications   Not on File      Allergies    Patient has no known allergies.    Review of Systems   Review of Systems  Physical Exam Updated Vital Signs BP  (!) 117/97   Pulse (!) 108   Temp 97.8 F (36.6 C)   Resp 17   Ht 5\' 11"  (1.803 m)   Wt 102.1 kg   SpO2 98%   BMI 31.38 kg/m  Physical Exam Constitutional:      General: She is not in acute distress. HENT:     Head: Normocephalic and atraumatic.  Eyes:     Conjunctiva/sclera: Conjunctivae normal.     Pupils: Pupils are equal, round, and reactive to light.  Cardiovascular:     Rate and Rhythm: Regular rhythm. Tachycardia present.  Pulmonary:     Effort: Pulmonary effort is normal. No respiratory distress.  Abdominal:     General: There is no distension.     Tenderness: There is no abdominal tenderness.  Skin:    General: Skin is warm and dry.  Neurological:     General: No focal deficit present.     Mental Status: She is alert and oriented to person, place, and time. Mental status is at baseline.     Cranial Nerves: No cranial nerve deficit.     Sensory: No sensory deficit.  Psychiatric:        Mood and Affect: Mood normal.        Behavior: Behavior normal.     ED Results / Procedures / Treatments   Labs (all labs ordered are listed, but only abnormal results are displayed) Labs  Reviewed  CBC - Abnormal; Notable for the following components:      Result Value   Hemoglobin 15.1 (*)    All other components within normal limits  DIFFERENTIAL - Abnormal; Notable for the following components:   Eosinophils Absolute 0.7 (*)    All other components within normal limits  COMPREHENSIVE METABOLIC PANEL - Abnormal; Notable for the following components:   Glucose, Bld 115 (*)    All other components within normal limits  CBG MONITORING, ED - Abnormal; Notable for the following components:   Glucose-Capillary 119 (*)    All other components within normal limits  ETHANOL  MAGNESIUM    EKG EKG Interpretation Date/Time:  Wednesday April 03 2023 21:32:57 EDT Ventricular Rate:  132 PR Interval:    QRS Duration:  87 QT Interval:  374 QTC Calculation: 553 R  Axis:   70  Text Interpretation: Sinus tachycardia Paired ventricular premature complexes RSR' in V1 or V2, probably normal variant Probable inferior infarct, recent Prolonged QTc, motion artifact Confirmed by Alvester Chou [phone]) on 04/03/2023 9:44:04 PM  Radiology No results found.  Procedures Procedures    Medications Ordered in ED Medications - No data to display  ED Course/ Medical Decision Making/ A&P Clinical Course as of 04/03/23 2323  Wed Apr 03, 2023  2321 Patient signed out to Dr Alona Bene EDP pending f/u on labs, IV migraine medications. [MT]    Clinical Course User Index [MT] Deriyah Kunath, Kermit Balo, MD                             Medical Decision Making Amount and/or Complexity of Data Reviewed Labs: ordered. Radiology: ordered.  Risk Prescription drug management.   This patient presents to the ED with concern for constellation of symptoms as noted above. This involves an extensive number of treatment options, and is a complaint that carries with it a high risk of complications and morbidity.  The differential diagnosis includes complex migraine most likely versus neuropsychiatric issue versus atypical headache versus other  Co-morbidities that complicate the patient evaluation: History of frequent migraines, at high risk of migraine complication  External records from outside source obtained and reviewed including outpatient MRI imaging, neurology outpatient evaluation  I ordered and personally interpreted labs.  The pertinent results include: No emergent findings on patient's blood work.  Electrolytes within normal limits.  Patient is pending troponin at signout  I ordered imaging studies including CT scan of the head I independently visualized and interpreted imaging which showed no emergent findings I agree with the radiologist interpretation  The patient was maintained on a cardiac monitor.  I personally viewed and interpreted the cardiac monitored which  showed an underlying rhythm of: Sinus tachycardia (close to patient's baseline tachycardia HR)  Per my interpretation the patient's ECG shows sinus tachycardia, mildly prolonged QT, but no acute ischemic findings  I ordered medication including IV migraine medications  I have reviewed the patients home medicines and have made adjustments as needed  Test Considered: Low suspicion for acute PE, SAH, meningitis.  No indication for emergent lumbar puncture or CT angiogram imaging at this time.   Dispostion:  Signed out          Final Clinical Impression(s) / ED Diagnoses Final diagnoses:  None    Rx / DC Orders ED Discharge Orders     None         Pretty Weltman, Kermit Balo, MD 04/03/23 2324

## 2023-04-03 NOTE — ED Triage Notes (Signed)
Pt states that she is having unusual vision, speech, tremors, & HA sxs that came on suddenly tonight, MD assessed in triage, pt much more verbal and clear with MD present, multiple complaints, has been seeing neurologist for several months

## 2023-04-04 LAB — TROPONIN I (HIGH SENSITIVITY): Troponin I (High Sensitivity): <2 ng/L (ref <18)

## 2023-04-04 NOTE — Discharge Instructions (Signed)
Continue to follow with your neurologist.  Return with any new or suddenly worsening symptoms.

## 2023-04-04 NOTE — ED Provider Notes (Signed)
Blood pressure 120/85, pulse (!) 116, temperature 97.8 F (36.6 C), resp. rate 18, height 5\' 11"  (1.803 m), weight 102.1 kg, SpO2 97 %.  Assuming care from Dr. Renaye Rakers.  In short, Laura Simpson is a 47 y.o. female with a chief complaint of Tremors .  Refer to the original H&P for additional details.  The current plan of care is to follow up after medication.  Troponin negative.  CT scan without acute finding.  Patient has baseline tachycardia and overall is feeling better.  Advised that she continue following with her neurologist.  She has an appointment on Monday.  Patient and husband are comfortable with discharge at this time.    Maia Plan, MD 04/04/23 [phone]

## 2023-06-25 ENCOUNTER — Encounter (HOSPITAL_BASED_OUTPATIENT_CLINIC_OR_DEPARTMENT_OTHER): Payer: Self-pay

## 2023-06-25 ENCOUNTER — Emergency Department (HOSPITAL_BASED_OUTPATIENT_CLINIC_OR_DEPARTMENT_OTHER): Payer: Medicaid Other

## 2023-06-25 ENCOUNTER — Emergency Department (HOSPITAL_BASED_OUTPATIENT_CLINIC_OR_DEPARTMENT_OTHER)
Admission: EM | Admit: 2023-06-25 | Discharge: 2023-06-25 | Disposition: A | Discharge status: Home / Self Care | Payer: Medicaid Other | Attending: Emergency Medicine | Admitting: Emergency Medicine

## 2023-06-25 DIAGNOSIS — R110 Nausea: Secondary | ICD-10-CM | POA: Insufficient documentation

## 2023-06-25 DIAGNOSIS — R1030 Lower abdominal pain, unspecified: Secondary | ICD-10-CM | POA: Diagnosis not present

## 2023-06-25 DIAGNOSIS — R42 Dizziness and giddiness: Secondary | ICD-10-CM | POA: Diagnosis not present

## 2023-06-25 DIAGNOSIS — F419 Anxiety disorder, unspecified: Secondary | ICD-10-CM

## 2023-06-25 DIAGNOSIS — R0789 Other chest pain: Secondary | ICD-10-CM | POA: Diagnosis present

## 2023-06-25 HISTORY — DX: Anxiety disorder, unspecified: F41.9

## 2023-06-25 LAB — BASIC METABOLIC PANEL
Anion gap: 10 (ref 5–15)
BUN: 12 mg/dL (ref 6–20)
CO2: 25 mmol/L (ref 22–32)
Calcium: 9.5 mg/dL (ref 8.9–10.3)
Chloride: 103 mmol/L (ref 98–111)
Creatinine, Ser: 0.84 mg/dL (ref 0.44–1.00)
GFR, Estimated: >60 mL/min (ref >60)
Glucose, Bld: 96 mg/dL (ref 70–99)
Potassium: 3.9 mmol/L (ref 3.5–5.1)
Sodium: 138 mmol/L (ref 135–145)

## 2023-06-25 LAB — TROPONIN I (HIGH SENSITIVITY): Troponin I (High Sensitivity): 4 ng/L (ref <18)

## 2023-06-25 LAB — CBC
HCT: 43.2 % (ref 36.0–46.0)
Hemoglobin: 14.6 g/dL (ref 12.0–15.0)
MCH: 31.9 pg (ref 26.0–34.0)
MCHC: 33.8 g/dL (ref 30.0–36.0)
MCV: 94.5 fL (ref 80.0–100.0)
Platelets: 264 10*3/uL (ref 150–400)
RBC: 4.57 MIL/uL (ref 3.87–5.11)
RDW: 12.2 % (ref 11.5–15.5)
WBC: 7.7 10*3/uL (ref 4.0–10.5)
nRBC: 0 % (ref 0.0–0.2)

## 2023-06-25 MED ORDER — LACTATED RINGERS IV BOLUS
1000.0000 mL | Freq: Once | INTRAVENOUS | Status: AC
  Administered 2023-06-25: 1000 mL via INTRAVENOUS

## 2023-06-25 NOTE — ED Triage Notes (Addendum)
Pt presents with complaint of CP around left breast and dizziness x 1 week. Pt seen by her PCP Monday and thought possibly pulled muscle. Also told it was her anxiety and to take lorazepam. Pt has been taking but not helping Negative for covid. Pt reports has been and out of doctors office for a month for different reasons. Woke up with Nauseated, dry heaves and 4 loose stools. Loss of appetite

## 2023-06-25 NOTE — ED Provider Notes (Signed)
[place] [facility] Provider Note   CSN: [id number] Arrival date & time: 06/25/23  0945     History  Chief Complaint  Patient presents with   Chest Pain    Laura Simpson is a 47 y.o. female here with constellation of symptoms including dizziness, lightheadedness, chest pain, lower abdominal pain, fatigue, voice changes. She says that she has a history of anxiety, but that she feels that her symptoms are not related to anxiety.  She has been taking half tablet of lorazepam daily, and says that she still felt lightheaded, and like she was going to pass out so she came in today.  She says she has been having nausea and loose stools as well. Reports voice changes, fatigue.  Just started CBT last week.  Her psychiatrist recently discontinued all of her psychiatric medications (Abilify, BuSpar, duloxetine) because the patient says she had a bad reaction to the SNRI.  She is too scared to restart these medications because of possibility of affecting her blood pressure, or having a bad reaction.  She is worried about her blood pressure being on the lower end and her heart rate being too high in the 1 teens, so she is currently only taking one quarter of her metoprolol day. She is worried about her other conditions including fatty liver, hypercalcemia, hydronephrosis.  She is concerned that she has Addison's disease because she says she has been under a lot of stress for the past 3 years, with marital difficulties.    Chest Pain Associated symptoms: dizziness and nausea   Associated symptoms: no cough and no shortness of breath        Home Medications Prior to Admission medications   Not on File      Allergies    Patient has no known allergies.    Review of Systems   Review of Systems  HENT:  Positive for voice change.   Respiratory:  Negative for cough and shortness of breath.   Cardiovascular:  Positive for chest pain.  Gastrointestinal:   Positive for diarrhea and nausea.  Neurological:  Positive for dizziness and light-headedness.    Physical Exam Updated Vital Signs BP (!) 92/57   Pulse 82   Temp 98 F (36.7 C) (Oral)   Resp 17   Ht 5\' 11"  (1.803 m)   Wt 95.3 kg   SpO2 100%   BMI 29.29 kg/m  Physical Exam Constitutional:      General: She is not in acute distress.    Appearance: She is not toxic-appearing.  Eyes:     Extraocular Movements: Extraocular movements intact.     Pupils: Pupils are equal, round, and reactive to light.  Cardiovascular:     Rate and Rhythm: Normal rate and regular rhythm.     Heart sounds: Normal heart sounds.  Pulmonary:     Effort: Pulmonary effort is normal.     Breath sounds: Normal breath sounds.  Skin:    General: Skin is warm and dry.     Capillary Refill: Capillary refill takes less than 2 seconds.  Neurological:     General: No focal deficit present.     Mental Status: She is alert.  Psychiatric:     Comments: Somewhat flat affect. Pleasant.      ED Results / Procedures / Treatments   Labs (all labs ordered are listed, but only abnormal results are displayed) Labs Reviewed  BASIC METABOLIC PANEL  CBC  TROPONIN I (HIGH SENSITIVITY)  TROPONIN I (HIGH SENSITIVITY)    EKG None  Radiology DG Chest 2 View  Result Date: 06/25/2023 CLINICAL DATA:  Chest pain EXAM: CHEST - 2 VIEW COMPARISON:  May 31, 2023 FINDINGS: The cardiomediastinal silhouette is normal in contour. No pleural effusion. No pneumothorax. No acute pleuroparenchymal abnormality. Visualized abdomen is unremarkable. No acute osseous abnormality noted. IMPRESSION: No acute cardiopulmonary abnormality. Electronically Signed   By: Meda Klinefelter M.D.   On: 06/25/2023 12:54    Procedures Procedures    Medications Ordered in ED Medications  lactated ringers bolus 1,000 mL (0 mLs Intravenous Stopped 06/25/23 1317)    ED Course/ Medical Decision Making/ A&P                                  Medical Decision Making 47 year old female with chest wall pain, reported presyncopal symptoms and dizziness. She is generally well-appearing, with no acute abnormalities on exam other than tacky mucous membranes.   Patient closely monitors her health and lab studies, and has been having close follow-up with her PCP and other specialists including neurology, general surgery, OB/GYN, and behavioral health, with 9 visits this month. She is normotensive, but very likely could have orthostasis contributing along with reduced p.o. intake. I am not concerned for MI, ACS, PE. Discharged home in stable condition with recommendation to increase p.o. fluid intake, use boost/Ensure shakes as needed for reduced appetite.  Amount and/or Complexity of Data Reviewed Labs: ordered. Radiology: ordered.          Final Clinical Impression(s) / ED Diagnoses Final diagnoses:  Chest wall pain  Anxiety  Dizziness    Rx / DC Orders ED Discharge Orders     None         Darral Dash, DO 06/25/23 1405    Melene Plan, DO 06/25/23 1414

## 2023-06-25 NOTE — ED Notes (Signed)
Called lab to follow up on troponin result. Blood sent to [facility]. Lab to follow up EDP aware

## 2023-06-25 NOTE — Discharge Instructions (Signed)
It was a pleasure caring for you today in the emergency department.  Your chest x-ray was normal, as well as your blood work.  Get plenty of rest over the next few days, drink plenty of fluids. Please follow up with your PCP in the next 3-5 days for a recheck. Please return to the emergency department for any worsening or worrisome symptoms.

## 2023-07-30 ENCOUNTER — Telehealth (HOSPITAL_COMMUNITY): Payer: Self-pay | Admitting: Licensed Clinical Social Worker

## 2023-08-19 ENCOUNTER — Ambulatory Visit (HOSPITAL_COMMUNITY): Payer: Medicaid Other

## 2023-08-19 ENCOUNTER — Encounter (HOSPITAL_COMMUNITY): Payer: Self-pay

## 2023-08-26 ENCOUNTER — Ambulatory Visit (HOSPITAL_COMMUNITY): Payer: Medicaid Other | Admitting: Professional

## 2023-08-26 DIAGNOSIS — F411 Generalized anxiety disorder: Secondary | ICD-10-CM | POA: Insufficient documentation

## 2023-08-26 DIAGNOSIS — F332 Major depressive disorder, recurrent severe without psychotic features: Secondary | ICD-10-CM | POA: Insufficient documentation
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
## 2023-09-04 ENCOUNTER — Telehealth (HOSPITAL_COMMUNITY): Payer: Self-pay | Admitting: Licensed Clinical Social Worker

## 2023-09-04 ENCOUNTER — Telehealth (HOSPITAL_COMMUNITY): Payer: Self-pay | Admitting: Professional

## 2023-09-04 NOTE — Telephone Encounter (Signed)
Pt called to report she spoke with Debarah Crape this morning about needing to lay down due to tremors, knowing that's against the rules for PHP. Pt wanted to discuss with this cln. Cln reports this accomodation is reasonable ad reiterates what Cln Debarah Crape stated this morning that treatment delay is not recommended due to tremors if pt can participate in group. Pt reports understanding. No SI/HI

## 2023-09-04 NOTE — Telephone Encounter (Signed)
Cln returned pt's call and apologized for the delay. Pt expressed concerns about attending PHP because she is unable to sit up due to medical issues. Cln advised pt that as long as she remains awake and is engaged in group, she can lie down. Cln answered pt's other questions regarding if pt should have to delay PHP or if it doesn't work for her. Pt agreed to start on 12/10 as planned and denied SI.

## 2023-09-05 ENCOUNTER — Emergency Department (HOSPITAL_BASED_OUTPATIENT_CLINIC_OR_DEPARTMENT_OTHER): Payer: Medicaid Other

## 2023-09-05 ENCOUNTER — Other Ambulatory Visit: Payer: Self-pay

## 2023-09-05 ENCOUNTER — Emergency Department (HOSPITAL_BASED_OUTPATIENT_CLINIC_OR_DEPARTMENT_OTHER)
Admission: EM | Admit: 2023-09-05 | Discharge: 2023-09-06 | Disposition: A | Discharge status: Home / Self Care | Payer: Medicaid Other

## 2023-09-05 ENCOUNTER — Encounter (HOSPITAL_BASED_OUTPATIENT_CLINIC_OR_DEPARTMENT_OTHER): Payer: Self-pay

## 2023-09-05 DIAGNOSIS — R251 Tremor, unspecified: Secondary | ICD-10-CM | POA: Insufficient documentation

## 2023-09-05 DIAGNOSIS — R0602 Shortness of breath: Secondary | ICD-10-CM | POA: Diagnosis not present

## 2023-09-05 DIAGNOSIS — R55 Syncope and collapse: Secondary | ICD-10-CM | POA: Diagnosis present

## 2023-09-05 HISTORY — DX: Unspecified intracranial injury with loss of consciousness status unknown, initial encounter: S06.9XAA

## 2023-09-05 LAB — COMPREHENSIVE METABOLIC PANEL
ALT: 16 U/L (ref 0–44)
AST: 15 U/L (ref 15–41)
Albumin: 3.8 g/dL (ref 3.5–5.0)
Alkaline Phosphatase: 64 U/L (ref 38–126)
Anion gap: 9 (ref 5–15)
BUN: 12 mg/dL (ref 6–20)
CO2: 24 mmol/L (ref 22–32)
Calcium: 9 mg/dL (ref 8.9–10.3)
Chloride: 102 mmol/L (ref 98–111)
Creatinine, Ser: 0.84 mg/dL (ref 0.44–1.00)
GFR, Estimated: >60 mL/min (ref >60)
Glucose, Bld: 115 mg/dL — ABNORMAL HIGH (ref 70–99)
Potassium: 3.6 mmol/L (ref 3.5–5.1)
Sodium: 135 mmol/L (ref 135–145)
Total Bilirubin: 0.6 mg/dL (ref <1.2)
Total Protein: 6.7 g/dL (ref 6.5–8.1)

## 2023-09-05 LAB — CBC WITH DIFFERENTIAL/PLATELET
Abs Immature Granulocytes: 0.02 10*3/uL (ref 0.00–0.07)
Basophils Absolute: 0.1 10*3/uL (ref 0.0–0.1)
Basophils Relative: 1 %
Eosinophils Absolute: 0.5 10*3/uL (ref 0.0–0.5)
Eosinophils Relative: 7 %
HCT: 42.5 % (ref 36.0–46.0)
Hemoglobin: 14.5 g/dL (ref 12.0–15.0)
Immature Granulocytes: 0 %
Lymphocytes Relative: 31 %
Lymphs Abs: 2.4 10*3/uL (ref 0.7–4.0)
MCH: 31.5 pg (ref 26.0–34.0)
MCHC: 34.1 g/dL (ref 30.0–36.0)
MCV: 92.4 fL (ref 80.0–100.0)
Monocytes Absolute: 0.6 10*3/uL (ref 0.1–1.0)
Monocytes Relative: 8 %
Neutro Abs: 3.9 10*3/uL (ref 1.7–7.7)
Neutrophils Relative %: 53 %
Platelets: 272 10*3/uL (ref 150–400)
RBC: 4.6 MIL/uL (ref 3.87–5.11)
RDW: 13 % (ref 11.5–15.5)
WBC: 7.5 10*3/uL (ref 4.0–10.5)
nRBC: 0 % (ref 0.0–0.2)

## 2023-09-05 LAB — URINALYSIS, ROUTINE W REFLEX MICROSCOPIC
Bilirubin Urine: NEGATIVE
Glucose, UA: NEGATIVE mg/dL
Hgb urine dipstick: NEGATIVE
Ketones, ur: NEGATIVE mg/dL
Leukocytes,Ua: NEGATIVE
Nitrite: NEGATIVE
Protein, ur: NEGATIVE mg/dL
Specific Gravity, Urine: <=1.005 (ref 1.005–1.030)
pH: 5.5 (ref 5.0–8.0)

## 2023-09-05 LAB — TROPONIN I (HIGH SENSITIVITY): Troponin I (High Sensitivity): <2 ng/L (ref <18)

## 2023-09-05 MED ORDER — SODIUM CHLORIDE 0.9 % IV BOLUS
1000.0000 mL | Freq: Once | INTRAVENOUS | Status: AC
  Administered 2023-09-05: 1000 mL via INTRAVENOUS

## 2023-09-05 NOTE — ED Notes (Signed)
Pt hooked up to the monitor with 5 lead, BP and pulse ox

## 2023-09-05 NOTE — ED Triage Notes (Addendum)
Pt states at 730 her heart "stop beating" and she felt like she was going to pass out, flushed and SHOB Hr at home was 142 States she has had weird symptoms since September and her PCP has been running tests  Pt has hx of tremors

## 2023-09-05 NOTE — Discharge Instructions (Signed)
As discussed please follow-up with your primary doctor, and cardiologist.  Please return if develop any fevers, chills, chest pain, palpitations, lightheadedness, you pass out, worsening shortness of breath or develop any new or worsening symptoms that are concerning to you.

## 2023-09-05 NOTE — ED Provider Notes (Signed)
[facility] Provider Note   CSN: [id number] Arrival date & time: 09/05/23  2029     History  Chief Complaint  Patient presents with   Shortness of Breath    Laura Simpson is a 47 y.o. female.  Is a 47 year old female presenting emergency department for near syncope.  She states at 730 she felt some palpitations and then felt her heart rate was racing.  Heart rate of 142 at home.  States she felt lightheaded like she is going to pass out, flushed with some shortness of breath.  Notes some chest discomfort at that time.  Reports that she continues to feel "off"    Shortness of Breath      Home Medications Prior to Admission medications   Not on File      Allergies    Patient has no known allergies.    Review of Systems   Review of Systems  Respiratory:  Positive for shortness of breath.     Physical Exam Updated Vital Signs BP 112/69   Pulse 74   Temp 97.9 F (36.6 C) (Oral)   Resp 20   Ht 5\' 11"  (1.803 m)   Wt 90.3 kg   SpO2 99%   BMI 27.75 kg/m  Physical Exam Vitals and nursing note reviewed.  Constitutional:      General: She is not in acute distress.    Appearance: She is not toxic-appearing.  Cardiovascular:     Rate and Rhythm: Normal rate and regular rhythm.  Pulmonary:     Effort: Pulmonary effort is normal.     Breath sounds: No decreased breath sounds, wheezing, rhonchi or rales.  Musculoskeletal:     Cervical back: Normal range of motion.     Right lower leg: No edema.     Left lower leg: No edema.  Skin:    General: Skin is warm and dry.     Capillary Refill: Capillary refill takes less than 2 seconds.  Neurological:     Mental Status: She is alert.     Cranial Nerves: No cranial nerve deficit.     Comments: Patient is tremulous.  No localizing motor deficits.  Good strength in all sensation.  Normal sensation.  Psychiatric:        Mood and Affect: Mood normal.     ED Results /  Procedures / Treatments   Labs (all labs ordered are listed, but only abnormal results are displayed) Labs Reviewed  COMPREHENSIVE METABOLIC PANEL - Abnormal; Notable for the following components:      Result Value   Glucose, Bld 115 (*)    All other components within normal limits  CBC WITH DIFFERENTIAL/PLATELET  URINALYSIS, ROUTINE W REFLEX MICROSCOPIC  TROPONIN I (HIGH SENSITIVITY)    EKG EKG Interpretation Date/Time:  Thursday September 05 2023 20:39:37 EST Ventricular Rate:  92 PR Interval:  133 QRS Duration:  79 QT Interval:  338 QTC Calculation: 419 R Axis:   74  Text Interpretation: Sinus rhythm Confirmed by Estanislado Pandy [phone]) on 09/05/2023 9:58:46 PM  Radiology DG Chest 2 View  Result Date: 09/05/2023 CLINICAL DATA:  Shortness of breath and elevated heart rate. EXAM: CHEST - 2 VIEW COMPARISON:  06/25/2023 FINDINGS: The heart size and mediastinal contours are within normal limits. Both lungs are clear. The visualized skeletal structures are unremarkable. IMPRESSION: No active cardiopulmonary disease. Electronically Signed   By: Burman Nieves M.D.   On: 09/05/2023 22:50    Procedures Procedures  Medications Ordered in ED Medications  sodium chloride 0.9 % bolus 1,000 mL (1,000 mLs Intravenous New Bag/Given 09/05/23 2258)    ED Course/ Medical Decision Making/ A&P Clinical Course as of 09/06/23 0002  Thu Sep 05, 2023  2217 Per chart review saw cardiology at [facility] on 11/6: "HPI:   Laura Simpson is a 47 y.o. female with a history of asthma, hypertension, inappropriate sinus tachycardia, PVCs, migraines, anxiety, TBI that was last seen on 12/28/2022 by Dr. Rudolpho Sevin for inappropriate sinus tachycardia. Ms. Gildner was sent to electrophysiology in April 2023 for an abnormal Zio monitor showing SVT, specifically atrial tachycardia. She was having symptoms of palpitations and chest pain. She had a full workup including an EP study that was negative, a stress test that  was negative, and eventually a left heart catheterization that was also negative. She continues to struggle with ongoing symptoms of fast heart rates, palpitations, and chest pain. She is here today for inappropriate sinus tachycardia follow-up. "   She was started on Corlanor at that time.  [TY]  2318 Per chart review saw neurology 08/05/23 and per their note "Assessment: I suspect many of her neurologic symptoms are related to migraine, depression and anxiety. I do not think examination of her spinal fluid would be very helpful. I do not believe she needs any repeat cerebral imaging at this point. It is encouraging that she can have some good days where she is functioning well, although she is significantly limited by her symptoms of headache, speech problems, tremulousness and dizziness. Want to make sure she does not have coexistent sleep apnea." [TY]    Clinical Course User Index [TY] Coral Spikes, DO                                 Medical Decision Making Is a 47 year old female presenting emergency department with near syncopal type event.  She is afebrile nontachycardic hemodynamically stable.  Patient appears to be normal sinus rhythm at a rate of 90 as interpreted by me on her monitor.EKG appears to be in normal sinus rhythm rate of 92 bpm.  Normal intervals.  No ST segment changes to indicate ischemia.  QTc 419.  Patient's initial workup in triage with no leukocytosis to suggest systemic infection.  Metabolic panel with no significant metabolic derangement.  Normal kidney function.  No transaminitis to suggest hepatobiliary disease.  UA negative for UTI.  Chest x-ray with no pneumonia.  Given her complaint of chest pain.  Troponin ordered; negative. ACS less likely.  She does appear quite anxious and reports she took Valium at 9:00 with some improvement of her symptoms.  Orthostatics negative.  Received IV fluids.  Per chart review she has been seen recently by neurology and cardiology;  history of SVT and inappropriate tachycardia.  See ED course for chart review. Patient's workup here reassuring feel that she is safe for discharge to follow-up with her specialist.  Amount and/or Complexity of Data Reviewed Labs: ordered. Radiology: ordered.         Final Clinical Impression(s) / ED Diagnoses Final diagnoses:  Near syncope    Rx / DC Orders ED Discharge Orders     None         Coral Spikes, DO 09/06/23 0002

## 2023-09-06 ENCOUNTER — Telehealth (HOSPITAL_COMMUNITY): Payer: Self-pay | Admitting: Professional

## 2023-09-10 ENCOUNTER — Ambulatory Visit (HOSPITAL_COMMUNITY): Payer: Medicaid Other

## 2023-09-12 ENCOUNTER — Ambulatory Visit (HOSPITAL_COMMUNITY): Payer: Medicaid Other

## 2023-09-17 ENCOUNTER — Ambulatory Visit (HOSPITAL_COMMUNITY): Payer: Medicaid Other

## 2023-09-18 ENCOUNTER — Ambulatory Visit (HOSPITAL_COMMUNITY): Payer: Medicaid Other

## 2023-09-20 ENCOUNTER — Ambulatory Visit (HOSPITAL_COMMUNITY): Payer: Medicaid Other

## 2023-09-27 ENCOUNTER — Ambulatory Visit (HOSPITAL_COMMUNITY): Payer: Medicaid Other

## 2023-09-30 ENCOUNTER — Ambulatory Visit (HOSPITAL_COMMUNITY): Payer: Medicaid Other

## 2023-10-01 ENCOUNTER — Ambulatory Visit (HOSPITAL_COMMUNITY): Payer: Medicaid Other

## 2023-10-03 ENCOUNTER — Ambulatory Visit (HOSPITAL_COMMUNITY): Payer: Medicaid Other

## 2023-10-04 ENCOUNTER — Ambulatory Visit (HOSPITAL_COMMUNITY): Payer: Medicaid Other

## 2023-11-01 ENCOUNTER — Encounter (HOSPITAL_BASED_OUTPATIENT_CLINIC_OR_DEPARTMENT_OTHER): Payer: Self-pay

## 2023-11-01 ENCOUNTER — Emergency Department (HOSPITAL_BASED_OUTPATIENT_CLINIC_OR_DEPARTMENT_OTHER)
Admission: EM | Admit: 2023-11-01 | Discharge: 2023-11-01 | Disposition: A | Discharge status: Home / Self Care | Payer: Medicaid Other | Attending: Emergency Medicine | Admitting: Emergency Medicine

## 2023-11-01 ENCOUNTER — Other Ambulatory Visit: Payer: Self-pay

## 2023-11-01 DIAGNOSIS — R42 Dizziness and giddiness: Secondary | ICD-10-CM | POA: Insufficient documentation

## 2023-11-01 DIAGNOSIS — Z20822 Contact with and (suspected) exposure to covid-19: Secondary | ICD-10-CM | POA: Diagnosis not present

## 2023-11-01 LAB — HEPATIC FUNCTION PANEL
ALT: 24 U/L (ref 0–44)
AST: 21 U/L (ref 15–41)
Albumin: 3.8 g/dL (ref 3.5–5.0)
Alkaline Phosphatase: 65 U/L (ref 38–126)
Bilirubin, Direct: 0.1 mg/dL (ref 0.0–0.2)
Indirect Bilirubin: 0.7 mg/dL (ref 0.3–0.9)
Total Bilirubin: 0.8 mg/dL (ref 0.0–1.2)
Total Protein: 7.2 g/dL (ref 6.5–8.1)

## 2023-11-01 LAB — URINALYSIS, ROUTINE W REFLEX MICROSCOPIC
Bilirubin Urine: NEGATIVE
Glucose, UA: NEGATIVE mg/dL
Hgb urine dipstick: NEGATIVE
Ketones, ur: 15 mg/dL — AB
Leukocytes,Ua: NEGATIVE
Nitrite: NEGATIVE
Protein, ur: 30 mg/dL — AB
Specific Gravity, Urine: 1.025 (ref 1.005–1.030)
pH: 7 (ref 5.0–8.0)

## 2023-11-01 LAB — BASIC METABOLIC PANEL
Anion gap: 10 (ref 5–15)
BUN: 9 mg/dL (ref 6–20)
CO2: 24 mmol/L (ref 22–32)
Calcium: 9.2 mg/dL (ref 8.9–10.3)
Chloride: 102 mmol/L (ref 98–111)
Creatinine, Ser: 0.82 mg/dL (ref 0.44–1.00)
GFR, Estimated: >60 mL/min (ref >60)
Glucose, Bld: 118 mg/dL — ABNORMAL HIGH (ref 70–99)
Potassium: 4 mmol/L (ref 3.5–5.1)
Sodium: 136 mmol/L (ref 135–145)

## 2023-11-01 LAB — CBC
HCT: 42 % (ref 36.0–46.0)
Hemoglobin: 14.4 g/dL (ref 12.0–15.0)
MCH: 32.2 pg (ref 26.0–34.0)
MCHC: 34.3 g/dL (ref 30.0–36.0)
MCV: 94 fL (ref 80.0–100.0)
Platelets: 256 10*3/uL (ref 150–400)
RBC: 4.47 MIL/uL (ref 3.87–5.11)
RDW: 12.5 % (ref 11.5–15.5)
WBC: 7.2 10*3/uL (ref 4.0–10.5)
nRBC: 0 % (ref 0.0–0.2)

## 2023-11-01 LAB — TROPONIN I (HIGH SENSITIVITY): Troponin I (High Sensitivity): <2 ng/L (ref <18)

## 2023-11-01 LAB — RESP PANEL BY RT-PCR (RSV, FLU A&B, COVID)  RVPGX2
Influenza A by PCR: NEGATIVE
Influenza B by PCR: NEGATIVE
Resp Syncytial Virus by PCR: NEGATIVE
SARS Coronavirus 2 by RT PCR: NEGATIVE

## 2023-11-01 LAB — URINALYSIS, MICROSCOPIC (REFLEX)

## 2023-11-01 LAB — LIPASE, BLOOD: Lipase: 37 U/L (ref 11–51)

## 2023-11-01 LAB — LACTIC ACID, PLASMA: Lactic Acid, Venous: 1.7 mmol/L (ref 0.5–1.9)

## 2023-11-01 MED ORDER — SODIUM CHLORIDE 0.9 % IV BOLUS
1000.0000 mL | Freq: Once | INTRAVENOUS | Status: AC
  Administered 2023-11-01: 1000 mL via INTRAVENOUS

## 2023-11-01 NOTE — ED Triage Notes (Addendum)
Pt reports migraine since Monday. Went to PCP yesterday and blood work abnormal. BP low yesterday and was told if still feeling bad come to ED. Headache resolved. Weak, tired, neck stiffness, head heavy and feels like she is going to pass out. Chest pain mid chest Confused and disoriented last few days. BP systolic yesterday was in 70"s

## 2023-11-01 NOTE — ED Provider Notes (Signed)
Emergency Department Provider Note   I have reviewed the triage vital signs and the nursing notes.   HISTORY  Chief Complaint Dizziness   HPI Laura Simpson is a 48 y.o. female with past medical history reviewed below presents to the emergency department with lightheadedness, intermittent headache, low blood pressures at home.  She feels intermittently confused and "out of it."  She had migraines for most of the week which have since stopped.  She saw her PCP yesterday and reports abnormal blood work at that visit including a low sodium.  She is concerned that her sodium may have drifted lower causing fatigue.  No fevers or chills.  Today, she had some mild mid chest pressure without radiation.  No exertional discomfort.  She is being followed by neurology for her headaches with MRI planned as an outpatient and reports recent diagnosis of dysautonomia but is having trouble establishing care with a provider for this.    Past Medical History:  Diagnosis Date   Anxiety    Hypoglycemia    Migraines    Tachycardia    TBI (traumatic brain injury) (HCC)     Review of Systems  Constitutional: No fever/chills. Positive fatigue and lightheadedness.  Eyes: No visual changes. ENT: No sore throat. Cardiovascular: Positive chest pain. Respiratory: Denies shortness of breath. Gastrointestinal: No abdominal pain. Positive nausea, no vomiting.  No diarrhea.   Musculoskeletal: Negative for back pain. Skin: Negative for rash. Neurological: Negative for focal weakness or numbness. Intermittent HA. None currently.    ____________________________________________   PHYSICAL EXAM:  VITAL SIGNS: ED Triage Vitals  Encounter Vitals Group     BP 11/01/23 0930 108/75     Pulse Rate 11/01/23 0930 99     Resp 11/01/23 0930 (!) 23     Temp 11/01/23 0930 97.6 F (36.4 C)     Temp src --      SpO2 11/01/23 0930 98 %     Weight --      Height 11/01/23 0934 5\' 11"  (1.803 m)   Constitutional:  Alert and oriented. Well appearing and in no acute distress. Eyes: Conjunctivae are normal. PERRL. EOMI. Head: Atraumatic. Nose: No congestion/rhinnorhea. Mouth/Throat: Mucous membranes are moist.  Oropharynx non-erythematous. Neck: No stridor.  No meningeal signs.  Cardiovascular: Normal rate, regular rhythm. Good peripheral circulation. Grossly normal heart sounds.   Respiratory: Normal respiratory effort.  No retractions. Lungs CTAB. Gastrointestinal: Soft and nontender. No distention.  Musculoskeletal: No lower extremity tenderness nor edema. No gross deformities of extremities. Neurologic:  Normal speech and language. No gross focal neurologic deficits are appreciated.  No facial asymmetry.  5/5 strength in the bilateral upper and lower extremities. Skin:  Skin is warm, dry and intact. No rash noted.   ____________________________________________   LABS (all labs ordered are listed, but only abnormal results are displayed)  Labs Reviewed  BASIC METABOLIC PANEL - Abnormal; Notable for the following components:      Result Value   Glucose, Bld 118 (*)    All other components within normal limits  URINALYSIS, ROUTINE W REFLEX MICROSCOPIC - Abnormal; Notable for the following components:   Ketones, ur 15 (*)    Protein, ur 30 (*)    All other components within normal limits  URINALYSIS, MICROSCOPIC (REFLEX) - Abnormal; Notable for the following components:   Bacteria, UA MANY (*)    All other components within normal limits  RESP PANEL BY RT-PCR (RSV, FLU A&B, COVID)  RVPGX2  CBC  LIPASE, BLOOD  HEPATIC FUNCTION PANEL  LACTIC ACID, PLASMA  TROPONIN I (HIGH SENSITIVITY)  TROPONIN I (HIGH SENSITIVITY)   ____________________________________________  EKG   EKG Interpretation Date/Time:  Friday November 01 2023 09:32:11 EST Ventricular Rate:  97 PR Interval:  129 QRS Duration:  78 QT Interval:  342 QTC Calculation: 435 R Axis:   85  Text Interpretation: Sinus rhythm  Confirmed by Alona Bene [phone]) on 11/01/2023 9:38:05 AM        ____________________________________________   PROCEDURES  Procedure(s) performed:   Procedures  None  ____________________________________________   INITIAL IMPRESSION / ASSESSMENT AND PLAN / ED COURSE  Pertinent labs & imaging results that were available during my care of the patient were reviewed by me and considered in my medical decision making (see chart for details).   This patient is Presenting for Evaluation of weakness, which does require a range of treatment options, and is a complaint that involves a high risk of morbidity and mortality.  The Differential Diagnoses include dehydration, developing infection, dysautonomia, migraine HA, ACS, etc.  Critical Interventions-    Medications  sodium chloride 0.9 % bolus 1,000 mL (0 mLs Intravenous Stopped 11/01/23 1153)    Reassessment after intervention: symptoms improved.    I did obtain Additional Historical Information from husband at bedside.    Clinical Laboratory Tests Ordered, included viral panel negative.  Lactic acid and troponin normal.  No leukocytosis or anemia.  Sodium normal at 136 without AKI or electrolyte disturbance.  UA shows many bacteria but negative nitrite, leukocytes, WBCs. Doubt UTI.   Radiologic Tests: Considered CT head but patient with frequent neuro imaging, no active HA, no neuro deficits, and established with Neurology with outpatient MRI planned. Defer imaging for now.   Cardiac Monitor Tracing which shows NSR.    Social Determinants of Health Risk patient is a non-smoker.   Medical Decision Making: Summary:  Patient presents to the emergency department for evaluation of intermittent headache, lightheadedness.  Seems consistent with mild dehydration.  No findings to strongly suggest infection or developing sepsis.  Troponin normal with > 6 hours of CP. Doubt ACS.   Reevaluation with update and discussion with patient.   Continues to have some fatigue/lightheaded symptoms.  She is here with her husband.  She is well-established and undergoing active outpatient follow-up for this more prolonged disease process.  She has no findings on exam, labs, vitals to prompt further emergent imaging or workup/admission. Discussed PO hydration at home and PCP follow up plan along with strict ED return precautions.   Patient's presentation is most consistent with acute presentation with potential threat to life or bodily function.   Disposition: discharge  ____________________________________________  FINAL CLINICAL IMPRESSION(S) / ED DIAGNOSES  Final diagnoses:  Lightheadedness    Note:  This document was prepared using Dragon voice recognition software and may include unintentional dictation errors.  Alona Bene, MD, [facility]    Deshanta Lady, Arlyss Repress, MD 11/01/23 1154

## 2023-11-01 NOTE — ED Notes (Signed)
 Fall risk armband Fall risk sign on door Patient wearing shoes

## 2023-11-01 NOTE — Discharge Instructions (Signed)
Your lab work here is reassuring.  Your sodium has normalized and you are not showing any signs of infection in your blood work or viral panel testing.  Please continue following with your primary care doctor as well as your neurology team.  As we discussed, stay hydrated with fluids and electrolytes.

## 2023-11-02 ENCOUNTER — Encounter (HOSPITAL_BASED_OUTPATIENT_CLINIC_OR_DEPARTMENT_OTHER): Payer: Self-pay | Admitting: Emergency Medicine

## 2023-11-02 ENCOUNTER — Emergency Department (HOSPITAL_BASED_OUTPATIENT_CLINIC_OR_DEPARTMENT_OTHER): Payer: Medicaid Other

## 2023-11-02 ENCOUNTER — Emergency Department (HOSPITAL_BASED_OUTPATIENT_CLINIC_OR_DEPARTMENT_OTHER)
Admission: EM | Admit: 2023-11-02 | Discharge: 2023-11-02 | Disposition: A | Discharge status: Home / Self Care | Payer: Medicaid Other | Attending: Emergency Medicine | Admitting: Emergency Medicine

## 2023-11-02 DIAGNOSIS — R519 Headache, unspecified: Secondary | ICD-10-CM | POA: Insufficient documentation

## 2023-11-02 DIAGNOSIS — G8929 Other chronic pain: Secondary | ICD-10-CM | POA: Diagnosis not present

## 2023-11-02 DIAGNOSIS — R42 Dizziness and giddiness: Secondary | ICD-10-CM | POA: Diagnosis not present

## 2023-11-02 NOTE — Discharge Instructions (Signed)
Follow back up with your primary care doctor.  Head CT tonight without any acute findings.  Symptoms seem to be consistent with what they are trying to evaluate further.  Return for any new or worse symptoms.  Also did give a referral to [facility] neurology if you want to switch neurologist.  Would continue with your outpatient MRI.

## 2023-11-02 NOTE — ED Triage Notes (Signed)
Tremors, headache, stiff neck, nausea. Seen for same yesterday. Discharged, but sx came back. Reports theses sx will "flare up" and is an ongoing issue since May 2024.

## 2023-11-02 NOTE — ED Provider Notes (Signed)
[place] [facility] AT [facility] Provider Note   CSN: [id number] Arrival date & time: 11/02/23  1754     History  Chief Complaint  Patient presents with   Dizziness   Headache    Laura Simpson is a 48 y.o. female.  Patient seen in the emergency department yesterday January 31.  Patient had extensive lab workup at that time.  They did not do head CT.  Patient's respiratory panel was negative lipase was negative liver function test were negative.  Troponin was less than 2 lactic acid was 1.7.  Basic metabolic panel glucose 118 GFR renal function was greater than 60 CBC was negative no leukocytosis and urinalysis was negative.  Patient's had similar symptoms to this in the past she is followed by neurology but says they are not very accommodating.  She would like to see a new urologist.  She does carry the diagnosis of this dysautonama.  Patient has not had a CT recently.  Her primary care doctor has arranged for an outpatient MRI.  Again today symptoms are very suggestive of things that she has had before.  Past medical history significant for migraines traumatic brain injury anxiety.  Patient's had abdominal hysterectomy.  Patient is never used tobacco products.  Today patient complaining of headache stiff neck but is not rigid tremors.  Some intermittent chest discomfort similar to yesterday.  Also some myalgias.  Vital signs on presentation temp 98.6 pulse 87 respiration 17 blood pressure 111/68 oxygen sats 99% on room air.       Home Medications Prior to Admission medications   Not on File      Allergies    Benadryl [diphenhydramine], Diphenhydramine hcl, Hydrocortisone, Klonopin [clonazepam], Topiramate, Trazodone, Trazodone and nefazodone, Gabapentin, Lamotrigine, and Pollen extract    Review of Systems   Review of Systems  Constitutional:  Negative for chills and fever.  HENT:  Negative for ear pain and sore throat.   Eyes:  Negative for pain  and visual disturbance.  Respiratory:  Negative for cough and shortness of breath.   Cardiovascular:  Negative for chest pain and palpitations.  Gastrointestinal:  Positive for nausea. Negative for abdominal pain and vomiting.  Genitourinary:  Negative for dysuria and hematuria.  Musculoskeletal:  Positive for myalgias. Negative for arthralgias and back pain.  Skin:  Negative for color change and rash.  Neurological:  Positive for tremors and headaches. Negative for seizures and syncope.  All other systems reviewed and are negative.   Physical Exam Updated Vital Signs BP 109/68 (BP Location: Right Arm)   Pulse 67   Temp 98.6 F (37 C) (Oral)   Resp 16   Ht 1.803 m (5\' 11" )   Wt 87.5 kg   SpO2 93%   BMI 26.92 kg/m  Physical Exam Vitals and nursing note reviewed.  Constitutional:      General: She is not in acute distress.    Appearance: Normal appearance. She is well-developed.  HENT:     Head: Normocephalic and atraumatic.     Mouth/Throat:     Mouth: Mucous membranes are moist.  Eyes:     Conjunctiva/sclera: Conjunctivae normal.  Cardiovascular:     Rate and Rhythm: Normal rate and regular rhythm.     Heart sounds: No murmur heard. Pulmonary:     Effort: Pulmonary effort is normal. No respiratory distress.     Breath sounds: Normal breath sounds. No wheezing or rhonchi.  Chest:     Chest wall: No tenderness.  Abdominal:     Palpations: Abdomen is soft.     Tenderness: There is no abdominal tenderness.  Musculoskeletal:        General: No swelling.     Cervical back: Neck supple. No rigidity.  Skin:    General: Skin is warm and dry.     Capillary Refill: Capillary refill takes less than 2 seconds.  Neurological:     General: No focal deficit present.     Mental Status: She is alert and oriented to person, place, and time.  Psychiatric:        Mood and Affect: Mood normal.     ED Results / Procedures / Treatments   Labs (all labs ordered are listed, but  only abnormal results are displayed) Labs Reviewed - No data to display  EKG None  Radiology CT Head Wo Contrast Result Date: 11/02/2023 CLINICAL DATA:  Tremors, headache and stiff neck. EXAM: CT HEAD WITHOUT CONTRAST TECHNIQUE: Contiguous axial images were obtained from the base of the skull through the vertex without intravenous contrast. RADIATION DOSE REDUCTION: This exam was performed according to the departmental dose-optimization program which includes automated exposure control, adjustment of the mA and/or kV according to patient size and/or use of iterative reconstruction technique. COMPARISON:  May 12, 2023 FINDINGS: Brain: No evidence of acute infarction, hemorrhage, hydrocephalus, extra-axial collection or mass lesion/mass effect. Vascular: No hyperdense vessel or unexpected calcification. Skull: Normal. Negative for fracture or focal lesion. Sinuses/Orbits: There is moderate to marked severity sphenoid sinus mucosal thickening. Other: None. IMPRESSION: No acute intracranial pathology. Electronically Signed   By: Aram Candela M.D.   On: 11/02/2023 22:37    Procedures Procedures    Medications Ordered in ED Medications - No data to display  ED Course/ Medical Decision Making/ A&P                                 Medical Decision Making Amount and/or Complexity of Data Reviewed Radiology: ordered.   Was reassured by her vital signs and her extensive lab workup from yesterday.  Did do CT head because patient was very concerned about brain swelling.  The CT head no acute intercranial pathology.  Also there was moderate sphenoid sinus mucosal thickening.  But no evidence of any acute sinus infection.  Is possible that these symptoms could be exacerbated by that somewhat.  The brain in particular had no evidence of acute infarction hemorrhage hydrocephalus or any mass lesion or mass effect.  Patient's symptoms similar to what she has had in the past.  Recommend follow back up  with primary care doctor continue with the MRI as an outpatient.  Also did give her a referral to [facility] neurology if she wants to change neurologist.  Patient stable for discharge home.  Final Clinical Impression(s) / ED Diagnoses Final diagnoses:  Chronic intractable headache, unspecified headache type    Rx / DC Orders ED Discharge Orders     None         Vanetta Mulders, MD 11/02/23 2352

## 2023-11-17 ENCOUNTER — Emergency Department (HOSPITAL_BASED_OUTPATIENT_CLINIC_OR_DEPARTMENT_OTHER): Payer: Medicaid Other

## 2023-11-17 ENCOUNTER — Other Ambulatory Visit: Payer: Self-pay

## 2023-11-17 ENCOUNTER — Emergency Department (HOSPITAL_BASED_OUTPATIENT_CLINIC_OR_DEPARTMENT_OTHER)
Admission: EM | Admit: 2023-11-17 | Discharge: 2023-11-18 | Disposition: A | Discharge status: Home / Self Care | Payer: Medicaid Other | Attending: Emergency Medicine | Admitting: Emergency Medicine

## 2023-11-17 DIAGNOSIS — J0110 Acute frontal sinusitis, unspecified: Secondary | ICD-10-CM | POA: Insufficient documentation

## 2023-11-17 DIAGNOSIS — R42 Dizziness and giddiness: Secondary | ICD-10-CM | POA: Diagnosis present

## 2023-11-17 LAB — CBC WITH DIFFERENTIAL/PLATELET
Abs Immature Granulocytes: 0.01 10*3/uL (ref 0.00–0.07)
Basophils Absolute: 0.1 10*3/uL (ref 0.0–0.1)
Basophils Relative: 1 %
Eosinophils Absolute: 0.5 10*3/uL (ref 0.0–0.5)
Eosinophils Relative: 5 %
HCT: 42.9 % (ref 36.0–46.0)
Hemoglobin: 14.4 g/dL (ref 12.0–15.0)
Immature Granulocytes: 0 %
Lymphocytes Relative: 27 %
Lymphs Abs: 2.4 10*3/uL (ref 0.7–4.0)
MCH: 31.9 pg (ref 26.0–34.0)
MCHC: 33.6 g/dL (ref 30.0–36.0)
MCV: 95.1 fL (ref 80.0–100.0)
Monocytes Absolute: 0.6 10*3/uL (ref 0.1–1.0)
Monocytes Relative: 6 %
Neutro Abs: 5.4 10*3/uL (ref 1.7–7.7)
Neutrophils Relative %: 61 %
Platelets: 282 10*3/uL (ref 150–400)
RBC: 4.51 MIL/uL (ref 3.87–5.11)
RDW: 12.8 % (ref 11.5–15.5)
WBC: 8.9 10*3/uL (ref 4.0–10.5)
nRBC: 0 % (ref 0.0–0.2)

## 2023-11-17 LAB — BASIC METABOLIC PANEL
Anion gap: 9 (ref 5–15)
BUN: 11 mg/dL (ref 6–20)
CO2: 26 mmol/L (ref 22–32)
Calcium: 9.5 mg/dL (ref 8.9–10.3)
Chloride: 101 mmol/L (ref 98–111)
Creatinine, Ser: 0.8 mg/dL (ref 0.44–1.00)
GFR, Estimated: >60 mL/min (ref >60)
Glucose, Bld: 104 mg/dL — ABNORMAL HIGH (ref 70–99)
Potassium: 3.9 mmol/L (ref 3.5–5.1)
Sodium: 136 mmol/L (ref 135–145)

## 2023-11-17 MED ORDER — SODIUM CHLORIDE 0.9 % IV SOLN
3.0000 g | Freq: Once | INTRAVENOUS | Status: AC
  Administered 2023-11-17: 3 g via INTRAVENOUS

## 2023-11-17 MED ORDER — IOHEXOL 300 MG/ML  SOLN
75.0000 mL | Freq: Once | INTRAMUSCULAR | Status: AC | PRN
  Administered 2023-11-17: 75 mL via INTRAVENOUS

## 2023-11-17 MED ORDER — AMOXICILLIN-POT CLAVULANATE 875-125 MG PO TABS: 1.0000 | ORAL_TABLET | Freq: Two times a day (BID) | ORAL | 0 refills | Status: AC

## 2023-11-17 NOTE — ED Notes (Signed)
 Patient transported to CT

## 2023-11-17 NOTE — ED Provider Notes (Signed)
[place] [facility] Provider Note   CSN: [id number] Arrival date & time: 11/17/23  1646     History Chief Complaint  Patient presents with   multiple complaints    Sinuses, body aches, dizziness.     HPI Laura Simpson is a 48 y.o. female presenting for a myriad of complaints. Endorses fatigue, body aches and dizziness. Hx of GAD/MDD Diagnosed with sinusitis from OP MRI and CT Told to hold ABX until seen by ENT.  Patient's recorded medical, surgical, social, medication list and allergies were reviewed in the Snapshot window as part of the initial history.   Review of Systems   Review of Systems  Constitutional:  Positive for fatigue. Negative for chills and fever.  HENT:  Positive for congestion, ear pain and facial swelling. Negative for sore throat.   Eyes:  Negative for pain and visual disturbance.  Respiratory:  Negative for cough and shortness of breath.   Cardiovascular:  Negative for chest pain and palpitations.  Gastrointestinal:  Negative for abdominal pain and vomiting.  Genitourinary:  Negative for dysuria and hematuria.  Musculoskeletal:  Negative for arthralgias and back pain.  Skin:  Negative for color change and rash.  Neurological:  Positive for headaches. Negative for seizures and syncope.  All other systems reviewed and are negative.   Physical Exam Updated Vital Signs BP 126/77 (BP Location: Right Arm)   Pulse 93   Temp 97.7 F (36.5 C) (Oral)   Resp 16   Ht 5\' 11"  (1.803 m)   Wt 90.7 kg   SpO2 100%   BMI 27.89 kg/m  Physical Exam Vitals and nursing note reviewed.  Constitutional:      General: She is not in acute distress.    Appearance: She is well-developed.  HENT:     Head: Normocephalic and atraumatic.  Eyes:     Conjunctiva/sclera: Conjunctivae normal.  Cardiovascular:     Rate and Rhythm: Normal rate and regular rhythm.     Heart sounds: No murmur heard. Pulmonary:     Effort: Pulmonary  effort is normal. No respiratory distress.     Breath sounds: Normal breath sounds.  Abdominal:     General: There is no distension.     Palpations: Abdomen is soft.     Tenderness: There is no abdominal tenderness. There is no right CVA tenderness or left CVA tenderness.  Musculoskeletal:        General: No swelling or tenderness. Normal range of motion.     Cervical back: Neck supple.  Skin:    General: Skin is warm and dry.  Neurological:     General: No focal deficit present.     Mental Status: She is alert and oriented to person, place, and time. Mental status is at baseline.     Cranial Nerves: No cranial nerve deficit.      ED Course/ Medical Decision Making/ A&P    Procedures Procedures   Medications Ordered in ED Medications  Ampicillin-Sulbactam (UNASYN) 3 g in sodium chloride 0.9 % 100 mL IVPB (0 g Intravenous Stopped 11/17/23 2201)  iohexol (OMNIPAQUE) 300 MG/ML solution 75 mL (75 mLs Intravenous Contrast Given 11/17/23 2231)    Medical Decision Making:    [name] is a 48 y.o. female who presented to the ED today with a myriad of symtpoms detailed above.     Additional history discussed with patient's family/caregivers.  Patient placed on continuous vitals and telemetry monitoring while in ED which was  reviewed periodically.   Complete initial physical exam performed, notably the patient  was HDS in NAD. Exquisitely tearful.       Reviewed and confirmed nursing documentation for past medical history, family history, social history.    Initial Assessment:   With the patient's presentation of ongoing headaches and symptoms, most likely diagnosis is acute sinusitis. Other diagnoses were considered including (but not limited to) CVA, mucorrmycosis, IC abscess. These are considered less likely due to history of present illness and physical exam findings.   This is most consistent with an acute life/limb threatening illness complicated by underlying chronic  conditions.  Initial Plan:  Screening labs including CBC and Metabolic panel to evaluate for infectious or metabolic etiology of disease.  CXR to evaluate for structural/infectious intrathoracic pathology.  CT head with contrast EKG to evaluate for cardiac pathology. Objective evaluation as below reviewed with plan for close reassessment  Initial Study Results:   Laboratory  All laboratory results reviewed without evidence of clinically relevant pathology.    EKG EKG was reviewed independently. Rate, rhythm, axis, intervals all examined and without medically relevant abnormality. ST segments without concerns for elevations.    Radiology  All images reviewed independently. Agree with radiology report at this time.   CT Head W or Wo Contrast Result Date: 11/17/2023 CLINICAL DATA:  Initial evaluation for meningitis, CNS infection. EXAM: CT HEAD WITHOUT AND WITH CONTRAST TECHNIQUE: Contiguous axial images were obtained from the base of the skull through the vertex without and with intravenous contrast. RADIATION DOSE REDUCTION: This exam was performed according to the departmental dose-optimization program which includes automated exposure control, adjustment of the mA and/or kV according to patient size and/or use of iterative reconstruction technique. CONTRAST:  75mL OMNIPAQUE IOHEXOL 300 MG/ML  SOLN COMPARISON:  Prior CT from 11/02/2023. FINDINGS: Brain: Cerebral volume within normal limits. No acute intracranial hemorrhage. No acute large vessel territory infarct. No mass lesion or midline shift. No hydrocephalus or extra-axial fluid collection. Following contrast administration, there is question of subtle diffuse leptomeningeal enhancement about both cerebral hemispheres (series 2, image 23 for example). Finding is not entirely certain, and could be technical in nature on this examination. No other abnormal enhancement. Vascular: No abnormal hyperdense vessel seen prior to contrast  administration. Following contrast administration, normal enhancement seen throughout the major intracranial vascular structures. Skull: Scalp soft tissues within normal limits.  Calvarium intact. Sinuses/Orbits: Globes and orbital soft tissues within normal limits. Moderate mucosal thickening with superimposed air-fluid level present within the left sphenoid sinus. Additional scattered mucosal thickening noted about the left greater than right ethmoidal air cells. Sequelae of prior sinus surgery noted. Mastoid air cells and middle ear cavities are clear. Other: None. IMPRESSION: 1. Question subtle diffuse leptomeningeal enhancement about both cerebral hemispheres. Difficult to be certain of this finding, as this appearance could potentially be technical in nature on this examination. Correlation with CSF analysis could be performed for further evaluation as warranted. 2. No other acute intracranial abnormality. 3. Left-sided sphenoid ethmoidal sinus disease, with superimposed air-fluid level within the left sphenoid sinus. Clinical correlation for possible acute sinusitis recommended. Electronically Signed   By: Rise Mu M.D.   On: 11/17/2023 23:36     Reassessment and Plan:   Lab work grossly reassuring.  Patient continues to have sinus disease.  She was treated with amp sulbactam to get acute treatment initiated.  She has follow-up with PCP very closely. Will continue her on Augmentin in the outpatient setting.  Intracranial disease  is considered grossly unlikely based on these findings. CT showed query findings but are nonspecific.  Her headache is improved on my reassessment.  She has full range of motion of the neck and lab work is currently inconsistent with acute meningitis. Discussed with patient she is in agreement and feels comfortable outpatient care management without further intervention.   Disposition:  I have considered need for hospitalization, however, considering all of the  above, I believe this patient is stable for discharge at this time.  Patient/family educated about specific return precautions for given chief complaint and symptoms.  Patient/family educated about follow-up with PCP.     Patient/family expressed understanding of return precautions and need for follow-up. Patient spoken to regarding all imaging and laboratory results and appropriate follow up for these results. All education provided in verbal form with additional information in written form. Time was allowed for answering of patient questions. Patient discharged.    Emergency Department Medication Summary:   Medications  Ampicillin-Sulbactam (UNASYN) 3 g in sodium chloride 0.9 % 100 mL IVPB (0 g Intravenous Stopped 11/17/23 2201)  iohexol (OMNIPAQUE) 300 MG/ML solution 75 mL (75 mLs Intravenous Contrast Given 11/17/23 2231)    Clinical Impression:  1. Acute non-recurrent frontal sinusitis      Discharge   Final Clinical Impression(s) / ED Diagnoses Final diagnoses:  Acute non-recurrent frontal sinusitis    Rx / DC Orders ED Discharge Orders          Ordered    amoxicillin-clavulanate (AUGMENTIN) 875-125 MG tablet  Every 12 hours        11/17/23 2325              Glyn Ade, MD 11/17/23 2347

## 2023-11-17 NOTE — ED Triage Notes (Signed)
Pt sent by urgent care for further evaluation. Pt reports dizziness and that she was seen here 2 weeks ago. Pt states that she had a MRI done this week. States that the MRI showed a Sphenoid sinus infection. Was prescribed doxycycline but has not started it. States that she is forgetful. Has been having episodes of diarrhea and increased urination.Pt also reports that UC sent her here for a CT scan. States that vision is blurry at times.

## 2023-12-02 ENCOUNTER — Telehealth: Payer: Self-pay | Admitting: Diagnostic Neuroimaging

## 2023-12-02 NOTE — Telephone Encounter (Signed)
 Patient called in to check on referral that was supposed to be sent to [place]. She said she spoke with office and they told her they sent it on 1/27. I called office back [facility] [address] [place] office). Spoke with Marchelle Folks, stated she couldn't find where they referred her to our office, but that she will work on this. Pt is currently established at [facility]-I asked why she is not wanting to return and she said because they don't have anyone there that can help with dysautonomia. When I spoke with referring office-she also mentioned they referred her to [facility] which I see she has an appt with them on 3/10, I asked why she would want to come to see [place] still and Marchelle Folks said that appt is with a Neuro ENT doctor? I let the patient and office know that we don't have anyone here who specializes in this dx, but we can definitely have referral reviewed once received and see what they recommend. Based on what I have already vaguely reviewed it seems like this needs to go to an academic center, which [facility] seems like a great start., but will review.

## 2024-01-01 ENCOUNTER — Emergency Department (HOSPITAL_BASED_OUTPATIENT_CLINIC_OR_DEPARTMENT_OTHER)
Admission: EM | Admit: 2024-01-01 | Discharge: 2024-01-01 | Disposition: A | Discharge status: Home / Self Care | Attending: Emergency Medicine | Admitting: Emergency Medicine

## 2024-01-01 ENCOUNTER — Emergency Department (HOSPITAL_BASED_OUTPATIENT_CLINIC_OR_DEPARTMENT_OTHER)

## 2024-01-01 ENCOUNTER — Other Ambulatory Visit: Payer: Self-pay

## 2024-01-01 ENCOUNTER — Encounter (HOSPITAL_BASED_OUTPATIENT_CLINIC_OR_DEPARTMENT_OTHER): Payer: Self-pay

## 2024-01-01 DIAGNOSIS — R000 Tachycardia, unspecified: Secondary | ICD-10-CM | POA: Diagnosis not present

## 2024-01-01 DIAGNOSIS — J101 Influenza due to other identified influenza virus with other respiratory manifestations: Secondary | ICD-10-CM | POA: Diagnosis not present

## 2024-01-01 DIAGNOSIS — R059 Cough, unspecified: Secondary | ICD-10-CM | POA: Diagnosis present

## 2024-01-01 HISTORY — DX: Postural orthostatic tachycardia syndrome (POTS): G90.A

## 2024-01-01 HISTORY — DX: Familial dysautonomia (riley-day): G90.1

## 2024-01-01 LAB — RESP PANEL BY RT-PCR (RSV, FLU A&B, COVID)  RVPGX2
Influenza A by PCR: POSITIVE — AB
Influenza B by PCR: NEGATIVE
Resp Syncytial Virus by PCR: NEGATIVE
SARS Coronavirus 2 by RT PCR: NEGATIVE

## 2024-01-01 MED ORDER — ONDANSETRON HCL 4 MG/2ML IJ SOLN: 4.0000 mg | Freq: Once | INTRAMUSCULAR | Status: DC

## 2024-01-01 MED ORDER — ACETAMINOPHEN 325 MG PO TABS
650.0000 mg | ORAL_TABLET | Freq: Once | ORAL | Status: AC | PRN
  Administered 2024-01-01: 650 mg via ORAL
  Filled 2024-01-01: qty 2

## 2024-01-01 MED ORDER — ALBUTEROL SULFATE HFA 108 (90 BASE) MCG/ACT IN AERS
2.0000 | INHALATION_SPRAY | RESPIRATORY_TRACT | Status: DC
  Administered 2024-01-01: 2 via RESPIRATORY_TRACT
  Filled 2024-01-01: qty 6.7

## 2024-01-01 MED ORDER — SODIUM CHLORIDE 0.9 % IV BOLUS
1000.0000 mL | Freq: Once | INTRAVENOUS | Status: AC
  Administered 2024-01-01: 1000 mL via INTRAVENOUS

## 2024-01-01 NOTE — ED Triage Notes (Signed)
 Pt reports symptoms starting Monday night. Husband (+) flu. Bodyaches, lightheadedness, persistent productive cough (blood noted in sputum), sinus pressure/pain, N/V/D, fever. Hx of pneumonia with respiratory illnesses. Intermittent SOB. No respiratory distress noted at rest. No tylenol or ibuprofen taken today.

## 2024-01-01 NOTE — ED Provider Notes (Signed)
 [facility] Provider Note   CSN: [id number] Arrival date & time: 01/01/24  1414     History  Chief Complaint  Patient presents with   URI    Laura Simpson is a 48 y.o. female with extensive medical history to include anxiety, dysautonomia, migraines, POTS, TBI.  Patient presents to ED for evaluation of URI symptoms.  Reports she has had URI symptoms since Monday.  States that her husband tested positive for the flu on Monday.  Endorsing fevers at home, bodyaches and chills, nausea, shortness of breath, posttussive emesis.  States she is not taking Tylenol at home.  Denies any other concerns.   URI Presenting symptoms: cough, fatigue, fever and sore throat   Associated symptoms: myalgias        Home Medications Prior to Admission medications   Medication Sig Start Date End Date Taking? Authorizing Provider  amoxicillin-clavulanate (AUGMENTIN) 875-125 MG tablet Take 1 tablet by mouth every 12 (twelve) hours. 11/17/23   Glyn Ade, MD      Allergies    Benadryl [diphenhydramine], Diphenhydramine hcl, Hydrocortisone, Klonopin [clonazepam], Topiramate, Trazodone, Trazodone and nefazodone, Gabapentin, Lamotrigine, and Pollen extract    Review of Systems   Review of Systems  Constitutional:  Positive for chills, fatigue and fever.  HENT:  Positive for sore throat.   Respiratory:  Positive for cough and shortness of breath.   Musculoskeletal:  Positive for myalgias.  All other systems reviewed and are negative.   Physical Exam Updated Vital Signs BP 97/65   Pulse 93   Temp 99.2 F (37.3 C)   Resp (!) 28   Ht 5\' 11"  (1.803 m)   Wt 94.3 kg   SpO2 98%   BMI 29.01 kg/m  Physical Exam Vitals and nursing note reviewed.  Constitutional:      General: She is not in acute distress.    Appearance: She is well-developed.  HENT:     Head: Normocephalic and atraumatic.     Mouth/Throat:     Pharynx: Posterior oropharyngeal  erythema present. No oropharyngeal exudate.     Comments: Posterior oropharynx with erythema, no exudate.  Uvula midline.  Handling secretions appropriately.  No drooling. Eyes:     Conjunctiva/sclera: Conjunctivae normal.  Cardiovascular:     Rate and Rhythm: Regular rhythm. Tachycardia present.     Heart sounds: No murmur heard. Pulmonary:     Effort: Pulmonary effort is normal. No respiratory distress.     Breath sounds: Normal breath sounds.  Abdominal:     Palpations: Abdomen is soft.     Tenderness: There is no abdominal tenderness.  Musculoskeletal:        General: No swelling.     Cervical back: Neck supple.  Skin:    General: Skin is warm and dry.     Capillary Refill: Capillary refill takes less than 2 seconds.  Neurological:     Mental Status: She is alert.  Psychiatric:        Mood and Affect: Mood normal.     ED Results / Procedures / Treatments   Labs (all labs ordered are listed, but only abnormal results are displayed) Labs Reviewed  RESP PANEL BY RT-PCR (RSV, FLU A&B, COVID)  RVPGX2 - Abnormal; Notable for the following components:      Result Value   Influenza A by PCR POSITIVE (*)    All other components within normal limits    EKG None  Radiology DG Chest 2 View  Result Date: 01/01/2024 CLINICAL DATA:  Shortness of breath. EXAM: CHEST - 2 VIEW COMPARISON:  11/20/2023. FINDINGS: Bilateral lung fields are clear. Bilateral costophrenic angles are clear. Normal cardio-mediastinal silhouette. No acute osseous abnormalities. The soft tissues are within normal limits. IMPRESSION: No active cardiopulmonary disease. Electronically Signed   By: Jules Schick M.D.   On: 01/01/2024 16:12    Procedures Procedures   Medications Ordered in ED Medications  albuterol (VENTOLIN HFA) 108 (90 Base) MCG/ACT inhaler 2 puff (2 puffs Inhalation Given 01/01/24 1543)  acetaminophen (TYLENOL) tablet 650 mg (650 mg Oral Given 01/01/24 1435)  sodium chloride 0.9 % bolus 1,000 mL  (0 mLs Intravenous Stopped 01/01/24 1630)    ED Course/ Medical Decision Making/ A&P  Medical Decision Making Amount and/or Complexity of Data Reviewed Radiology: ordered.  Risk OTC drugs. Prescription drug management.   48 year old female presents for evaluation.  Please see HPI for further details.  On examination the patient is febrile and tachycardic.  Lung sounds are clear bilaterally, not hypoxic.  Abdomen is soft and compressible throughout.  Neurological examinations at baseline.  Posterior oropharynx with erythema, no exudate.  Uvula midline.  Handling secretions appropriately.  No drooling or change phonation.  Viral panel here positive for influenza A.  This is most likely why the patient is febrile and tachycardic.  Her chest x-ray is unremarkable.  She was given 1 L of fluid and Tylenol for fever.  After Tylenol, her temperature did normalize.  After fluid, her pulse rate also normalized.  She was also given albuterol inhaler for shortness of breath.  At this time patient will be discharged home.  Advised to push fluids, take Tylenol, purchase Zicam.  Encouraged to follow-up with her PCP.  Will also send patient home albuterol inhaler for shortness of breath.   Final Clinical Impression(s) / ED Diagnoses Final diagnoses:  Influenza A    Rx / DC Orders ED Discharge Orders     None         Al Decant, PA-C 01/01/24 1644    Melene Plan, DO 01/01/24 2106

## 2024-01-01 NOTE — Discharge Instructions (Signed)
 It was a pleasure taking part in your care.  As discussed, you have the flu.  Please push fluids at home.  Please take Tylenol every 6 hours as needed for fevers.  Please purchase Zicam and take this as directed.  Utilize albuterol inhaler for shortness of breath every 4 hours.  Follow-up with your PCP.  Return to the ED with any new or worsening symptoms.

## 2024-02-18 ENCOUNTER — Ambulatory Visit (HOSPITAL_COMMUNITY): Admitting: Professional

## 2024-02-18 DIAGNOSIS — F411 Generalized anxiety disorder: Secondary | ICD-10-CM | POA: Diagnosis not present

## 2024-02-18 DIAGNOSIS — F331 Major depressive disorder, recurrent, moderate: Secondary | ICD-10-CM | POA: Insufficient documentation

## 2024-02-18 DIAGNOSIS — F329 Major depressive disorder, single episode, unspecified: Secondary | ICD-10-CM
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
## 2024-02-25 ENCOUNTER — Ambulatory Visit (INDEPENDENT_AMBULATORY_CARE_PROVIDER_SITE_OTHER): Admitting: Licensed Clinical Social Worker

## 2024-02-25 DIAGNOSIS — F332 Major depressive disorder, recurrent severe without psychotic features: Secondary | ICD-10-CM | POA: Diagnosis not present

## 2024-02-25 DIAGNOSIS — F331 Major depressive disorder, recurrent, moderate: Secondary | ICD-10-CM

## 2024-02-25 DIAGNOSIS — F411 Generalized anxiety disorder: Secondary | ICD-10-CM

## 2024-02-25 NOTE — Progress Notes (Signed)
 Virtual Visit via Video Note  I connected with Laura Simpson on 02/25/24 at  9:00 AM EDT by a video enabled telemedicine application and verified that I am speaking with the correct person using two identifiers.  Location: Patient: Home Provider: Office   I discussed the limitations of evaluation and management by telemedicine and the availability of in person appointments. The patient expressed understanding and agreed to proceed.   I discussed the assessment and treatment plan with the patient. The patient was provided an opportunity to ask questions and all were answered. The patient agreed with the plan and demonstrated an understanding of the instructions.   The patient was advised to call back or seek an in-person evaluation if the symptoms worsen or if the condition fails to improve as anticipated.  I provided 15 minutes of non-face-to-face time during this encounter.   Levester Reagin, NP    Psychiatric Initial Adult Assessment   Patient Identification: Laura Simpson MRN:  [id number] Date of Evaluation:  02/25/2024 Referral Source: Pecolia Bourbon therapist Chief Complaint:  No chief complaint on file.  Visit Diagnosis: No diagnosis found.  History of Present Illness:  Laura Simpson 48 year old Caucasian female who was referred by her therapist Pecolia Bourbon.  She reports she was referred last November/December in 2024.  States she was struggling with multiple stressors at that time however, symptoms have since resolved.  States she was admitted on a medical floor due to declining health.  States at that time her neurologist attempted to adjust her medications.  She reports she feels that she has been addicted to Ativan , however she and her primary care is currently working on a plan to taper off her medications so she is not dependent on lorazepam .  Reports she is currently prescribed Cymbalta 60 mg.  States she has attempted to adjust the Cymbalta however feels stable at current  dosage.  States she continues to take BuSpar 15 mg twice daily as directed.  And has Ativan  twice daily.  She denied concerns with suicidal or homicidal ideations.  States her main stressors is related to anxiety.  States she is followed by neuropsychiatry through [facility].  States she has been tried on Klonopin in the past without symptom relief.  Reports stressors related to relationship issues between she and her husband.  Increased anxiety with starting a new job and declining health issues.  States they were unable to find a diagnosis for reported symptoms.  States some of her issues have since resolved.  Continues to struggle with depression and anxiety and decreased motivation.  Patient to start partial hospitalization programming on 02/25/2024.    Reports her PHQ-9 was 3, which she states has improved considerably over the past 6 months.  Reports she is eager to learn additional coping skills so she does not spiral out of control again.  No concerns related to suicidal or homicidal ideations.  During evaluation Laura Simpson is sitting in chair seen and evaluated virtually; she is alert/oriented x 4; calm/cooperative; and mood congruent with affect.  Patient is speaking in a clear tone at moderate volume, and normal pace; with good eye contact.Her thought process is coherent and relevant; There is no indication that she is currently responding to internal/external stimuli or experiencing delusional thought content.  Patient denies suicidal/self-harm/homicidal ideation, psychosis, and paranoia.  Patient has remained calm throughout assessment and has answered questions appropriately.   Associated Signs/Symptoms: Depression Symptoms:  depressed mood, (Hypo) Manic Symptoms:  Distractibility, Anxiety Symptoms:  Excessive Worry,  Psychotic Symptoms:  Hallucinations: None PTSD Symptoms: NA  Past Psychiatric History:   Previous Psychotropic Medications: Yes   Substance Abuse History in the last  12 months:  No.  Consequences of Substance Abuse: NA  Past Medical History:  Past Medical History:  Diagnosis Date   Anxiety    Dysautonomia (HCC)    Hypoglycemia    Migraines    POTS (postural orthostatic tachycardia syndrome)    Tachycardia    TBI (traumatic brain injury) (HCC)     Past Surgical History:  Procedure Laterality Date   ABDOMINAL HYSTERECTOMY     OOPHORECTOMY Right    TUBAL LIGATION      Family Psychiatric History:   Family History:  Family History  Problem Relation Age of Onset   Hyperlipidemia Father    Hypertension Father    Stroke Maternal Grandmother    Cancer Paternal Grandmother        kidney   Alcohol abuse Paternal Grandfather    Hypertension Paternal Grandfather    Liver disease Paternal Grandfather     Social History:   Social History   Socioeconomic History   Marital status: Married    Spouse name: Not on file   Number of children: Not on file   Years of education: Not on file   Highest education level: Not on file  Occupational History   Not on file  Tobacco Use   Smoking status: Never   Smokeless tobacco: Never  Vaping Use   Vaping status: Never Used  Substance and Sexual Activity   Alcohol use: No   Drug use: No   Sexual activity: Not on file  Other Topics Concern   Not on file  Social History Narrative   Not on file   Social Drivers of Health   Financial Resource Strain: Not on file  Food Insecurity: Low Risk  (08/07/2023)   Received from [facility]   Hunger Vital Sign    Worried About Running Out of Food in the Last Year: Never true    Ran Out of Food in the Last Year: Never true  Transportation Needs: No Transportation Needs (08/07/2023)   Received from [facility]    In the past 12 months, has lack of reliable transportation kept you from medical appointments, meetings, work or from getting things needed for daily living? : No  Physical Activity: Not on file  Stress: Not on file  Social  Connections: Unknown (02/01/2022)   [facility] from [facility]   Social Network    Social Network: Not on file    Additional Social History:   Allergies:   Allergies  Allergen Reactions   Benadryl  [Diphenhydramine ]     flushing   Diphenhydramine  Hcl     Other Reaction(s): Flushing   Hydrocortisone     HA  Rectal cream   Klonopin [Clonazepam]     Headache   Topiramate     jittery  Hadaches   Trazodone     Other Reaction(s): Other (See Comments)  insomnia   Trazodone And Nefazodone     nausea   Gabapentin Palpitations    Flushed feeling   Lamotrigine Rash    Rash on trunk   Pollen Extract     Other Reaction(s): Other (See Comments)  Rhinosinusitis Symptoms    Metabolic Disorder Labs: No results found for: "HGBA1C", "MPG" No results found for: "PROLACTIN" Lab Results  Component Value Date   CHOL 161 01/01/2017   TRIG 72 01/01/2017  HDL 57 01/01/2017   CHOLHDL 2.8 01/01/2017   Lab Results  Component Value Date   TSH 1.58 01/01/2017    Therapeutic Level Labs: No results found for: "LITHIUM" No results found for: "CBMZ" No results found for: "VALPROATE"  Current Medications: Current Outpatient Medications  Medication Sig Dispense Refill   amoxicillin -clavulanate (AUGMENTIN ) 875-125 MG tablet Take 1 tablet by mouth every 12 (twelve) hours. 14 tablet 0   No current facility-administered medications for this visit.    Musculoskeletal: Virtual video assessment  Psychiatric Specialty Exam: Review of Systems  Psychiatric/Behavioral:  Negative for agitation and sleep disturbance.   All other systems reviewed and are negative.   There were no vitals taken for this visit.There is no height or weight on file to calculate BMI.  General Appearance: Casual  Eye Contact:  Good  Speech:  Clear and Coherent  Volume:  Normal  Mood:  Anxious and Depressed  Affect:  Congruent  Thought Process:  Coherent  Orientation:  Full (Time, Place, and  Person)  Thought Content:  Logical  Suicidal Thoughts:  No  Homicidal Thoughts:  No  Memory:  Immediate;   Good Recent;   Good  Judgement:  Good  Insight:  Good  Psychomotor Activity:  Normal  Concentration:  Concentration: Good  Recall:  Good  Fund of Knowledge:Good  Language: Fair  Akathisia:  No  Handed:  Right  AIMS (if indicated):  not done  Assets:  Communication Skills Desire for Improvement Resilience Social Support  ADL's:  Intact  Cognition: WNL  Sleep:  Good   Screenings: Insurance account manager from 02/18/2024 in [facility] Counselor from 08/26/2023 in [facility] Office Visit from 01/08/2017 in [facility] at [facility]  PHQ-2 Total Score 2 5 0  PHQ-9 Total Score 3 14 --      Flowsheet Row Counselor from 02/18/2024 in [facility] ED from 01/01/2024 in [facility] Emergency Department at [facility] ED from 11/17/2023 in [facility] Emergency Department at [facility]  C-SSRS RISK CATEGORY No Risk No Risk No Risk       Assessment and Plan:  Patient to start partial hospitalization programming -[facility] Continue medication taper as indicated by primary care  Laura Simpson was enrolled in Partial Hospitalization Program,([place]) patient's current medications are to be continued, Cymbalta, Ativan  and BuSpar a comprehensive treatment plan will be developed and side effects of medications have been reviewed with patient  Treatment options and alternatives reviewed with patient and patient understands the above plan. Treatment plan was reviewed and agreed upon by NP T.Harles Lied and patient Laura Simpson need for group services.  Collaboration of Care: Medication Management AEB currently on a Ativan  taper continue Cymbalta 60 mg daily and BuSpar 15 mg p.o. twice daily  Patient/Guardian was advised  Release of Information must be obtained prior to any record release in order to collaborate their care with an outside provider. Patient/Guardian was advised if they have not already done so to contact the registration department to sign all necessary forms in order for us  to release information regarding their care.   Consent: Patient/Guardian gives verbal consent for treatment and assignment of benefits for services provided during this visit. Patient/Guardian expressed understanding and agreed to proceed.   Levester Reagin, NP 5/27/202511:59 AM

## 2024-02-26 ENCOUNTER — Ambulatory Visit (INDEPENDENT_AMBULATORY_CARE_PROVIDER_SITE_OTHER): Admitting: Licensed Clinical Social Worker

## 2024-02-26 DIAGNOSIS — F332 Major depressive disorder, recurrent severe without psychotic features: Secondary | ICD-10-CM | POA: Diagnosis not present

## 2024-02-26 DIAGNOSIS — F411 Generalized anxiety disorder: Secondary | ICD-10-CM

## 2024-02-27 ENCOUNTER — Ambulatory Visit (INDEPENDENT_AMBULATORY_CARE_PROVIDER_SITE_OTHER): Admitting: Licensed Clinical Social Worker

## 2024-02-27 DIAGNOSIS — F411 Generalized anxiety disorder: Secondary | ICD-10-CM

## 2024-02-27 DIAGNOSIS — F331 Major depressive disorder, recurrent, moderate: Secondary | ICD-10-CM

## 2024-02-27 NOTE — Psych (Signed)
 Virtual Visit via Video Note  I connected with Laura Simpson on 02/27/24 at  9:00 AM EDT by a video enabled telemedicine application and verified that I am speaking with the correct person using two identifiers.  Location: Patient: pt's home in [place], [place] Provider: clinical home office in [place], [place]   I discussed the limitations of evaluation and management by telemedicine and the availability of in person appointments. The patient expressed understanding and agreed to proceed.   I discussed the assessment and treatment plan with the patient. The patient was provided an opportunity to ask questions and all were answered. The patient agreed with the plan and demonstrated an understanding of the instructions.   The patient was advised to call back or seek an in-person evaluation if the symptoms worsen or if the condition fails to improve as anticipated.  I provided 240 minutes of non-face-to-face time during this encounter.   Laura Deputy, LCSW     [facility] PHP THERAPIST PROGRESS NOTE  Laura Simpson [id number]   Session Time: 9:00 am - 10:00 am  Participation Level: Active  Behavioral Response: CasualAlertDepressed  Type of Therapy: Group Therapy  Treatment Goals addressed: Coping  Progress Towards Goals: Progressing  Interventions: CBT, DBT, Solution Focused, Strength-based, Supportive, and Reframing  Therapist Response: Clinician led check-in regarding current stressors and situation, and review of patient completed daily inventory. Clinician utilized active listening and empathetic response and validated patient emotions. Clinician facilitated processing group on pertinent issues.?   Summary: Patient arrived within time allowed. Patient rates her mood at a 4 on a scale of 1-10 with 10 being best. Pt reported that she is sick today and it is impacting her mood, largely because she was once bedridden for a significant amount of time due to illness, so she experiences  increased anxiety when she gets sick. When asked about sleep and appetite, pt reports she slept 9 hours last night and ate 3 meals yesterday. Pt denied experiencing SI/SH thoughts since last session. Pt able to process.?Pt engaged in discussion.?      Session Time: 10:00 am - 11:00 am  Participation Level: Active  Behavioral Response: CasualAlertDepressed  Type of Therapy: Group Therapy  Treatment Goals addressed: Coping  Progress Towards Goals: Progressing  Interventions: CBT, DBT, Solution Focused, Strength-based, Supportive, and Reframing  Therapist Response: Clinician led group on hopelessness and trying to cultivate hope for the future. Clinician utilized ACT principles to inform discussion.   Summary: Pt was attentive during discussion but was not able to engage due to time constraints.    Session Time: 11:00 am - 12:00 pm  Participation Level: Active  Behavioral Response: CasualAlertDepressed  Type of Therapy: Group Therapy  Treatment Goals addressed: Coping  Progress Towards Goals: Progressing  Interventions: CBT, DBT, Solution Focused, Strength-based, Supportive, and Reframing  Therapist Response: Clinician guided patients through an exercise called "perspective-taking" in which patients were asked to visualize themselves holding onto something that is currently causing them pain and to think of it as a memory. Patients were invited to share their thoughts and feelings after the exercise was completed. Clinician utilized ACT principles to inform discussion.   Summary: Pt engaged in discussion. Pt reports "my issues are very fresh and even though I put it away... I could just forget about it and leave it alone, but I can't just throw it in the garbage. It's gonna be with me for the rest of my life." She is receptive to feedback.   Session Time: 12:00 pm -  1:00 pm  Participation Level: Active  Behavioral Response: CasualAlertDepressed  Type of Therapy: Group  Therapy  Treatment Goals addressed: Coping  Progress Towards Goals: Progressing  Interventions: CBT, DBT, Solution Focused, Strength-based, Supportive, and Reframing  Therapist Response: 12:00 - 12:50 pm: Group was led by occupational therapist, Rockne Chyle. 12:50 - 1:00 pm: Clinician led check-out. Clinician assessed for immediate needs, medication compliance and efficacy, and safety concerns?  Summary: 12:00 - 12:50 pm: Pt engaged and participated in discussion. 12:50 - 1:00 pm: At check-out, patient contracts for safety.?Patient demonstrates progress as evidenced by her continued engagement and by being receptive to treatment. Patient denies SI/HI/self-harm thoughts at the end of group and agrees to seek help should those thoughts/feelings occur.?   Suicidal/Homicidal: Nowithout intent/plan  Plan: ?Pt will continue in PHP and medication management while continuing to work on decreasing depression symptoms,?SI, and anxiety symptoms,?and increasing the ability to self manage symptoms.    Collaboration of Care: Other none required for this visit  Patient/Guardian was advised Release of Information must be obtained prior to any record release in order to collaborate their care with an outside provider. Patient/Guardian was advised if they have not already done so to contact the registration department to sign all necessary forms in order for us  to release information regarding their care.   Consent: Patient/Guardian gives verbal consent for treatment and assignment of benefits for services provided during this visit. Patient/Guardian expressed understanding and agreed to proceed.   Diagnosis: MDD (major depressive disorder), recurrent episode, moderate (HCC) [F33.1]    1. MDD (major depressive disorder), recurrent episode, moderate (HCC)   2. Generalized anxiety disorder       Laura Simpson, Buzz Cass 02/27/2024

## 2024-02-28 ENCOUNTER — Ambulatory Visit (INDEPENDENT_AMBULATORY_CARE_PROVIDER_SITE_OTHER): Admitting: Licensed Clinical Social Worker

## 2024-02-28 DIAGNOSIS — F411 Generalized anxiety disorder: Secondary | ICD-10-CM

## 2024-02-28 DIAGNOSIS — F332 Major depressive disorder, recurrent severe without psychotic features: Secondary | ICD-10-CM

## 2024-02-29 NOTE — Psych (Signed)
 Virtual Visit via Video Note  I connected with Laura Simpson on 02/28/24 at  9:00 AM EDT by a video enabled telemedicine application and verified that I am speaking with the correct person using two identifiers.  Location: Patient: patient home Provider: clinical home office   I discussed the limitations of evaluation and management by telemedicine and the availability of in person appointments. The patient expressed understanding and agreed to proceed.  I discussed the assessment and treatment plan with the patient. The patient was provided an opportunity to ask questions and all were answered. The patient agreed with the plan and demonstrated an understanding of the instructions.   The patient was advised to call back or seek an in-person evaluation if the symptoms worsen or if the condition fails to improve as anticipated.  Pt was provided 240 minutes of non-face-to-face time during this encounter.   Laura Herb, LCSW   [facility] PHP THERAPIST PROGRESS NOTE  Laura Simpson [id number]  Session Time: 9:00 - 10:00  Participation Level: Active  Behavioral Response: CasualAlertDepressed  Type of Therapy: Group Therapy  Treatment Goals addressed: Coping  Progress Towards Goals: Initial  Interventions: CBT, DBT, Supportive, and Reframing  Summary: Laura Simpson is a 48 y.o. female who presents with depression symptoms.  Clinician led check-in regarding current stressors and situation, and review of patient completed daily inventory. Clinician utilized active listening and empathetic response and validated patient emotions. Clinician facilitated processing group on pertinent issues.   Therapist Response: Patient arrived within time allowed. Patient rates her mood at a 4 on a scale of 1-10 with 10 being best. Pt states she feels "anxious." Pt states she slept 10 hours and ate 3x. Pt reports she is physcially ill and thinks she may have the flu. Pt has appointment this afternoon with  her PCP. Pt reports she left the house yesterday for the first time all week. Pt reports increased irritability. Pt able to process. Pt engaged in discussion.           Session Time: 10:00 am - 11:00 am   Participation Level: Active   Behavioral Response: CasualAlertDepressed   Type of Therapy: Group Therapy   Treatment Goals addressed: Coping   Progress Towards Goals: Progressing   Interventions: CBT, DBT, Solution Focused, Strength-based, Supportive, and Reframing   Summary: Cln introduced the "Thoughts" distraction skills from DBT distress tolerance skill ACCEPTS. Cln discussed how this set of distraction skills can be helpful when in situations where outside resources are limited or unavailable. Group practiced and brainstormed ways to apply the Thought skill.    Therapist Response:  Pt engaged in discussion and identifies ways to practice the skill.            Session Time: 11:00 -12:00   Participation Level: Active   Behavioral Response: CasualAlertDepressed   Type of Therapy: Group Therapy   Treatment Goals addressed: Coping   Progress Towards Goals: Progressing   Interventions: CBT, DBT, Solution Focused, Strength-based, Supportive, and Reframing   Summary: Cln led discussion on ways to manage stressors and feelings over the weekend. Group members  brainstormed things to do over the weekend for multiple levels of energy, access, and moods. Cln reviewed crisis services should they be needed and provided pt's with the text crisis line, mobile crisis, national suicide hotline, [facility] 24/7 line, and information on [facility].      Therapist Response: Pt engaged in discussion and is able to identify 3 ideas of what to do over the  weekend to keep their mind engaged.            Session Time: 12:00 -1:00   Participation Level: Active   Behavioral Response: CasualAlertDepressed   Type of Therapy: Group therapy   Treatment Goals addressed: Coping   Progress  Towards Goals: Progressing   Interventions: OT group   Summary: 12:00 - 12:50: Occupational Therapy group with cln E. Hollan.  12:50 - 1:00 Clinician assessed for immediate needs, medication compliance and efficacy, and safety concerns.   Therapist Response: 12:00 - 12:50: Pt participated 12:50 - 1:00 pm: At check-out, patient reports no immediate concerns. Patient demonstrates progress as evidenced by continued engagement and responsiveness to treatment. Patient denies SI/HI/self-harm thoughts at the end of group.    Suicidal/Homicidal: Nowithout intent/plan  Plan: Pt will continue in PHP while working to decrease depression symptoms, increase daily functioning, increase ability to manage symptoms in a healthy manner.   Collaboration of Care: Medication Management AEB T Lewis, NP  Patient/Guardian was advised Release of Information must be obtained prior to any record release in order to collaborate their care with an outside provider. Patient/Guardian was advised if they have not already done so to contact the registration department to sign all necessary forms in order for us  to release information regarding their care.   Consent: Patient/Guardian gives verbal consent for treatment and assignment of benefits for services provided during this visit. Patient/Guardian expressed understanding and agreed to proceed.   Diagnosis: Severe episode of recurrent major depressive disorder, without psychotic features (HCC) [F33.2]    1. Severe episode of recurrent major depressive disorder, without psychotic features (HCC)   2. Generalized anxiety disorder       Laura Herb, LCSW

## 2024-02-29 NOTE — Psych (Signed)
 Virtual Visit via Video Note  I connected with Laura Simpson on 02/25/24 at  9:00 AM EDT by a video enabled telemedicine application and verified that I am speaking with the correct person using two identifiers.  Location: Patient: patient home Provider: clinical home office   I discussed the limitations of evaluation and management by telemedicine and the availability of in person appointments. The patient expressed understanding and agreed to proceed.  I discussed the assessment and treatment plan with the patient. The patient was provided an opportunity to ask questions and all were answered. The patient agreed with the plan and demonstrated an understanding of the instructions.   The patient was advised to call back or seek an in-person evaluation if the symptoms worsen or if the condition fails to improve as anticipated.  Pt was provided 240 minutes of non-face-to-face time during this encounter.   Ethelle Herb, LCSW   [facility] PHP THERAPIST PROGRESS NOTE  Laura Simpson [id number]  Session Time: 9:00 - 10:00  Participation Level: Active  Behavioral Response: CasualAlertDepressed  Type of Therapy: Group Therapy  Treatment Goals addressed: Coping  Progress Towards Goals: Initial  Interventions: CBT, DBT, Supportive, and Reframing  Summary: Laura Simpson is a 48 y.o. female who presents with depression symptoms.  Clinician led check-in regarding current stressors and situation, and review of patient completed daily inventory. Clinician utilized active listening and empathetic response and validated patient emotions. Clinician facilitated processing group on pertinent issues.   Therapist Response: Patient arrived within time allowed. Patient rates her mood at a 5 on a scale of 1-10 with 10 being best. Pt states she feels "sleepy." Pt states she slept 8 hours and ate 1x. Pt reports morning's are difficult for her and she is still trying to wake up. Pt states her weekend was  "fantastic" and she was able to leave the house with her husband. Pt reports she is tiring faster than she is used to and had to nap multiple times due to fatigue. Pt reports struggling with guilt and self judgment. Pt able to process. Pt engaged in discussion.           Session Time: 10:00 am - 11:00 am   Participation Level: Active   Behavioral Response: CasualAlertDepressed   Type of Therapy: Group Therapy   Treatment Goals addressed: Coping   Progress Towards Goals: Progressing   Interventions: CBT, DBT, Solution Focused, Strength-based, Supportive, and Reframing   Summary: Cln led processing group for pt's current struggles. Group members shared stressors and provided support and feedback. Cln brought in topics of boundaries, healthy relationships, and unhealthy thought processes to inform discussion.    Therapist Response: Pt able to process and provide support to group.            Session Time: 11:00 -12:00   Participation Level: Active   Behavioral Response: CasualAlertDepressed   Type of Therapy: Group Therapy   Treatment Goals addressed: Coping   Progress Towards Goals: Progressing   Interventions: CBT, DBT, Solution Focused, Strength-based, Supportive, and Reframing   Summary: Cln led discussion on DBT dialectics and balance. Cln encouraged pt's to utilize AND statements to cue their brain to hold two opposing ideas. Cln provided examples and group members created their own AND statements.   Therapist Response:  Pt engaged in discussion and created AND statement around recognzing progress.            Session Time: 12:00 -1:00   Participation Level: Active   Behavioral Response: CasualAlertDepressed  Type of Therapy: Group therapy   Treatment Goals addressed: Coping   Progress Towards Goals: Progressing   Interventions: OT group   Summary: 12:00 - 12:50: Occupational Therapy group with cln E. Hollan.  12:50 - 1:00 Clinician assessed for  immediate needs, medication compliance and efficacy, and safety concerns.   Therapist Response: 12:00 - 12:50: Pt participated 12:50 - 1:00 pm: At check-out, patient reports no immediate concerns. Patient demonstrates progress as evidenced by participating in first group session. Patient denies SI/HI/self-harm thoughts at the end of group.    Suicidal/Homicidal: Nowithout intent/plan  Plan: Pt will continue in PHP while working to decrease depression symptoms, increase daily functioning, increase ability to manage symptoms in a healthy manner.   Collaboration of Care: Medication Management AEB T Lewis, NP  Patient/Guardian was advised Release of Information must be obtained prior to any record release in order to collaborate their care with an outside provider. Patient/Guardian was advised if they have not already done so to contact the registration department to sign all necessary forms in order for us  to release information regarding their care.   Consent: Patient/Guardian gives verbal consent for treatment and assignment of benefits for services provided during this visit. Patient/Guardian expressed understanding and agreed to proceed.   Diagnosis: Severe episode of recurrent major depressive disorder, without psychotic features (HCC) [F33.2]    1. Severe episode of recurrent major depressive disorder, without psychotic features (HCC)   2. Generalized anxiety disorder       Ethelle Herb, LCSW

## 2024-02-29 NOTE — Psych (Signed)
 Virtual Visit via Video Note  I connected with Laura Simpson on 02/26/24 at  9:00 AM EDT by a video enabled telemedicine application and verified that I am speaking with the correct person using two identifiers.  Location: Patient: patient home Provider: clinical home office   I discussed the limitations of evaluation and management by telemedicine and the availability of in person appointments. The patient expressed understanding and agreed to proceed.  I discussed the assessment and treatment plan with the patient. The patient was provided an opportunity to ask questions and all were answered. The patient agreed with the plan and demonstrated an understanding of the instructions.   The patient was advised to call back or seek an in-person evaluation if the symptoms worsen or if the condition fails to improve as anticipated.  Pt was provided 240 minutes of non-face-to-face time during this encounter.   Ethelle Herb, LCSW   [facility] PHP THERAPIST PROGRESS NOTE  Toneka Simpson [id number]  Session Time: 9:00 - 10:00  Participation Level: Active  Behavioral Response: CasualAlertDepressed  Type of Therapy: Group Therapy  Treatment Goals addressed: Coping  Progress Towards Goals: Initial  Interventions: CBT, DBT, Supportive, and Reframing  Summary: Laura Simpson is a 48 y.o. female who presents with depression symptoms.  Clinician led check-in regarding current stressors and situation, and review of patient completed daily inventory. Clinician utilized active listening and empathetic response and validated patient emotions. Clinician facilitated processing group on pertinent issues.   Therapist Response: Patient arrived within time allowed. Patient rates her mood at a 5 on a scale of 1-10 with 10 being best. Pt states she feels "exhausted." Pt states she slept 9 hours and ate 3x. Pt reports she is still adjusting to group schedule. Pt reports feeling tired yesterday and had to go to  sleep right after dinner. Pt reports guilt for not doing more around the house yesterday. Pt able to process. Pt engaged in discussion.           Session Time: 10:00 am - 11:00 am   Participation Level: Active   Behavioral Response: CasualAlertDepressed   Type of Therapy: Group Therapy   Treatment Goals addressed: Coping   Progress Towards Goals: Progressing   Interventions: CBT, DBT, Solution Focused, Strength-based, Supportive, and Reframing   Summary: Cln led processing group for pt's current struggles. Group members shared stressors and provided support and feedback. Cln brought in topics of boundaries, healthy relationships, and unhealthy thought processes to inform discussion.     Therapist Response: Pt able to process and provide support to group.              Session Time: 11:00 -12:00   Participation Level: Active   Behavioral Response: CasualAlertDepressed   Type of Therapy: Group Therapy   Treatment Goals addressed: Coping   Progress Towards Goals: Progressing   Interventions: Strength-based, Supportive, and Reframing   Summary: Chaplaincy group with Laura Simpson   Therapist Response: Pt participated and engaged in discussion.            Session Time: 12:00 -1:00   Participation Level: Active   Behavioral Response: CasualAlertDepressed   Type of Therapy: Group therapy   Treatment Goals addressed: Coping   Progress Towards Goals: Progressing   Interventions: OT group   Summary: 12:00 - 12:50: Occupational Therapy group with cln Laura Simpson.  12:50 - 1:00 Clinician assessed for immediate needs, medication compliance and efficacy, and safety concerns.   Therapist Response: 12:00 - 12:50: Pt participated 12:50 -  1:00 pm: At check-out, patient reports no immediate concerns. Patient demonstrates progress as evidenced by continued engagement and responsiveness to treatment. Patient denies SI/HI/self-harm thoughts at the end of  group.    Suicidal/Homicidal: Nowithout intent/plan  Plan: Pt will continue in PHP while working to decrease depression symptoms, increase daily functioning, increase ability to manage symptoms in a healthy manner.   Collaboration of Care: Medication Management AEB Laura Lewis, NP  Patient/Guardian was advised Release of Information must be obtained prior to any record release in order to collaborate their care with an outside provider. Patient/Guardian was advised if they have not already done so to contact the registration department to sign all necessary forms in order for us  to release information regarding their care.   Consent: Patient/Guardian gives verbal consent for treatment and assignment of benefits for services provided during this visit. Patient/Guardian expressed understanding and agreed to proceed.   Diagnosis: Severe episode of recurrent major depressive disorder, without psychotic features (HCC) [F33.2]    1. Severe episode of recurrent major depressive disorder, without psychotic features (HCC)   2. Generalized anxiety disorder       Ethelle Herb, LCSW

## 2024-03-02 ENCOUNTER — Ambulatory Visit (INDEPENDENT_AMBULATORY_CARE_PROVIDER_SITE_OTHER)

## 2024-03-02 DIAGNOSIS — F332 Major depressive disorder, recurrent severe without psychotic features: Secondary | ICD-10-CM | POA: Diagnosis not present

## 2024-03-02 DIAGNOSIS — F411 Generalized anxiety disorder: Secondary | ICD-10-CM

## 2024-03-03 ENCOUNTER — Ambulatory Visit (HOSPITAL_COMMUNITY)

## 2024-03-03 DIAGNOSIS — F332 Major depressive disorder, recurrent severe without psychotic features: Secondary | ICD-10-CM

## 2024-03-03 DIAGNOSIS — F411 Generalized anxiety disorder: Secondary | ICD-10-CM

## 2024-03-04 ENCOUNTER — Ambulatory Visit (INDEPENDENT_AMBULATORY_CARE_PROVIDER_SITE_OTHER): Admitting: Professional

## 2024-03-04 DIAGNOSIS — F332 Major depressive disorder, recurrent severe without psychotic features: Secondary | ICD-10-CM

## 2024-03-04 DIAGNOSIS — F411 Generalized anxiety disorder: Secondary | ICD-10-CM

## 2024-03-04 NOTE — Psych (Signed)
 Virtual Visit via Video Note  I connected with Monika Armel on 03/02/24 at  9:00 AM EDT by a video enabled telemedicine application and verified that I am speaking with the correct person using two identifiers.  Location: Patient: patient home Provider: clinical home office   I discussed the limitations of evaluation and management by telemedicine and the availability of in person appointments. The patient expressed understanding and agreed to proceed.  I discussed the assessment and treatment plan with the patient. The patient was provided an opportunity to ask questions and all were answered. The patient agreed with the plan and demonstrated an understanding of the instructions.   The patient was advised to call back or seek an in-person evaluation if the symptoms worsen or if the condition fails to improve as anticipated.  Pt was provided 240 minutes of non-face-to-face time during this encounter.   [name], [facility]   [facility] PHP THERAPIST PROGRESS NOTE  Ellisha Bankson [id number]  Session Time: 9:00 - 10:00  Participation Level: Active  Behavioral Response: CasualAlertDepressed  Type of Therapy: Group Therapy  Treatment Goals addressed: Coping  Progress Towards Goals: Initial  Interventions: CBT, DBT, Supportive, and Reframing  Summary: Artisha Capri is a 48 y.o. female who presents with depression symptoms. Clinician led check-in regarding current stressors and situation, and review of patient completed daily inventory. Clinician utilized active listening and empathetic response and validated patient emotions. Clinician facilitated processing group on pertinent issues.   Therapist Response: Patient arrived within time allowed. Patient rates her mood at a 6 on a scale of 1-10 with 10 being best. Pt states she feels "anxious." Pt states she slept 8.5 hours and ate 2x. Pt reports she feels as though her anxiety and depression have increased since starting treatment. She  understands her new medications and being sick are also effecting her mood. Pt able to process. Pt engaged in discussion.         Session Time: 10:00 am - 11:00 am   Participation Level: Active   Behavioral Response: CasualAlertDepressed   Type of Therapy: Group Therapy   Treatment Goals addressed: Coping   Progress Towards Goals: Progressing   Interventions: CBT, DBT, Solution Focused, Strength-based, Supportive, and Reframing   Summary: Group spent time discussing opposite action, cognitive distortions such as black/white thinking and "should" statements. Group spent time discussing how to reframe these thoughts and practiced together. Group spent time discussing celebrating the "little wins" to continue to encourage motivation.   Therapist Response:  Pt engaged in discussion and identifies ways opposite action, reframing, and celebrating "little wins" can be helpful.             Session Time: 11:00 -12:00   Participation Level: Active   Behavioral Response: CasualAlertDepressed   Type of Therapy: Group Therapy   Treatment Goals addressed: Coping   Progress Towards Goals: Progressing   Interventions: CBT, DBT, Solution Focused, Strength-based, Supportive, and Reframing   Summary: Cln led discussion on Positive Psychology. Group watched TedTalk: "The Happiness Advantage" and discussed how positive psychology can be helpful to rewire brains to view the world and situations in a more positive manner.    Therapist Response: Pt engaged in discussion. She reports she connects with the idea of "moving the goal post" which puts happiness over the cognitive horizon.          Session Time: 12:00 -1:00   Participation Level: Active   Behavioral Response: CasualAlertDepressed   Type of Therapy: Group therapy  Treatment Goals addressed: Coping   Progress Towards Goals: Progressing   Interventions: OT group   Summary: 12:00 - 12:50: Occupational Therapy group with cln  E. Hollan.  12:50 - 1:00 Clinician assessed for immediate needs, medication compliance and efficacy, and safety concerns.   Therapist Response: 12:00 - 12:50: Pt participated 12:50 - 1:00 pm: At check-out, patient reports no immediate concerns. Patient demonstrates progress as evidenced by continued engagement and responsiveness to treatment. Patient denies SI/HI/self-harm thoughts at the end of group.    Suicidal/Homicidal: Nowithout intent/plan  Plan: Pt will continue in PHP while working to decrease depression symptoms, increase daily functioning, increase ability to manage symptoms in a healthy manner.   Collaboration of Care: Medication Management AEB T Lewis, NP  Patient/Guardian was advised Release of Information must be obtained prior to any record release in order to collaborate their care with an outside provider. Patient/Guardian was advised if they have not already done so to contact the registration department to sign all necessary forms in order for us  to release information regarding their care.   Consent: Patient/Guardian gives verbal consent for treatment and assignment of benefits for services provided during this visit. Patient/Guardian expressed understanding and agreed to proceed.   Diagnosis: Severe episode of recurrent major depressive disorder, without psychotic features (HCC) [F33.2]    1. Severe episode of recurrent major depressive disorder, without psychotic features (HCC)   2. Generalized anxiety disorder       [name], [facility]

## 2024-03-05 ENCOUNTER — Ambulatory Visit (INDEPENDENT_AMBULATORY_CARE_PROVIDER_SITE_OTHER): Admitting: Licensed Clinical Social Worker

## 2024-03-05 ENCOUNTER — Encounter (HOSPITAL_COMMUNITY): Payer: Self-pay

## 2024-03-05 DIAGNOSIS — F332 Major depressive disorder, recurrent severe without psychotic features: Secondary | ICD-10-CM

## 2024-03-05 NOTE — Psych (Signed)
 Virtual Visit via Video Note  I connected with Laura Simpson on 03/04/24 at  9:00 AM EDT by a video enabled telemedicine application and verified that I am speaking with the correct person using two identifiers.  Location: Patient: patient home Provider: clinical home office   I discussed the limitations of evaluation and management by telemedicine and the availability of in person appointments. The patient expressed understanding and agreed to proceed.  I discussed the assessment and treatment plan with the patient. The patient was provided an opportunity to ask questions and all were answered. The patient agreed with the plan and demonstrated an understanding of the instructions.   The patient was advised to call back or seek an in-person evaluation if the symptoms worsen or if the condition fails to improve as anticipated.  Pt was provided 240 minutes of non-face-to-face time during this encounter.   [name], [facility]   [facility] PHP THERAPIST PROGRESS NOTE  Laura Simpson [id number]  Session Time: 9:00 - 10:00  Participation Level: Active  Behavioral Response: CasualAlertDepressed  Type of Therapy: Group Therapy  Treatment Goals addressed: Coping  Progress Towards Goals: Initial  Interventions: CBT, DBT, Supportive, and Reframing  Summary: Laura Simpson is a 48 y.o. female who presents with depression symptoms. Clinician led check-in regarding current stressors and situation, and review of patient completed daily inventory. Clinician utilized active listening and empathetic response and validated patient emotions. Clinician facilitated processing group on pertinent issues.   Therapist Response: Patient arrived within time allowed. Patient rates her mood at a "7" on a scale of 1-10 with 10 being best. Pt states she feels "good." Pt states she slept 9 hours and ate 3x. Pt reports she had a good afternoon and spent quality time with husband. She shares she is frustrated with  feeling like she continues to have to prove herself to him due to previous infidelity. She shares she had a realization that he needs to handle "his stuff" and she can only do so much to fix that for him. Pt able to process. Pt engaged in discussion.      Session Time: 10:00 am - 11:00 am   Participation Level: Active   Behavioral Response: CasualAlertDepressed   Type of Therapy: Group Therapy   Treatment Goals addressed: Coping   Progress Towards Goals: Progressing   Interventions: CBT, DBT, Solution Focused, Strength-based, Supportive, and Reframing   Summary: Group spent time discussing the "trust pool" and how to (re)build trust with others. Group discussed the importance of "grace and space" for self and others.   Therapist Response:  Pt engaged in discussion. She reports she wants to be able to show her husband that she is trustworthy, and knows it will take time.   Session Time: 11:00 -12:00   Participation Level: Active   Behavioral Response: CasualAlertDepressed   Type of Therapy: Group Therapy   Treatment Goals addressed: Coping   Progress Towards Goals: Progressing   Interventions: Strength-based, Supportive, and Reframing   Summary: Chaplaincy group with K. Claussen   Therapist Response: Pt participated and engaged in discussion.      Session Time: 12:00 -1:00   Participation Level: Active   Behavioral Response: CasualAlertDepressed   Type of Therapy: Group therapy   Treatment Goals addressed: Coping   Progress Towards Goals: Progressing   Interventions: OT group   Summary: 12:00 - 12:50: Occupational Therapy group with cln E. Hollan.  12:50 - 1:00 Clinician assessed for immediate needs, medication compliance and efficacy, and safety  concerns.   Therapist Response: 12:00 - 12:50: Pt participated 12:50 - 1:00 pm: At check-out, patient reports no immediate concerns. Patient demonstrates progress as evidenced by continued engagement and  responsiveness to treatment. Patient denies SI/HI/self-harm thoughts at the end of group.    Suicidal/Homicidal: Nowithout intent/plan  Plan: Pt will continue in PHP while working to decrease depression symptoms, increase daily functioning, increase ability to manage symptoms in a healthy manner.   Collaboration of Care: Medication Management AEB T Lewis, NP  Patient/Guardian was advised Release of Information must be obtained prior to any record release in order to collaborate their care with an outside provider. Patient/Guardian was advised if they have not already done so to contact the registration department to sign all necessary forms in order for us  to release information regarding their care.   Consent: Patient/Guardian gives verbal consent for treatment and assignment of benefits for services provided during this visit. Patient/Guardian expressed understanding and agreed to proceed.   Diagnosis: Severe episode of recurrent major depressive disorder, without psychotic features (HCC) [F33.2]    1. Severe episode of recurrent major depressive disorder, without psychotic features (HCC)   2. Generalized anxiety disorder       [name], [facility]

## 2024-03-05 NOTE — Progress Notes (Signed)
 Spoke with patient via Teams video call, used 2 identifiers to correctly identify patient. This is her first time in [facility] as referred by her therapist. She has been dealing with a lot of work related issues and medical issues as well. 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 Another MD told her she could so she went back to work. Shortly after got Flu A twice and COVID, March she resigned. May 2024 she was told by her neurologist that she needed to cut back on her medications. She went off her meds and by Sept she was not doing well. She had severe anxiety and depression. She went inpatient. Was also diagnosed with dysautonomia. On scale 1-10 as 10 being worst she rates depression at 0 and anxiety at 5. Denies SI/HI or AV hallucinations. PHQ9=3. No issues or complaints.

## 2024-03-05 NOTE — Psych (Signed)
 Virtual Visit via Video Note  I connected with Laura Simpson on 03/03/24 at  9:00 AM EDT by a video enabled telemedicine application and verified that I am speaking with the correct person using two identifiers.  Location: Patient: patient home Provider: clinical home office   I discussed the limitations of evaluation and management by telemedicine and the availability of in person appointments. The patient expressed understanding and agreed to proceed.  I discussed the assessment and treatment plan with the patient. The patient was provided an opportunity to ask questions and all were answered. The patient agreed with the plan and demonstrated an understanding of the instructions.   The patient was advised to call back or seek an in-person evaluation if the symptoms worsen or if the condition fails to improve as anticipated.  Pt was provided 240 minutes of non-face-to-face time during this encounter.   [name], [facility]   [facility] PHP THERAPIST PROGRESS NOTE  Laura Simpson [id number]  Session Time: 9:00 - 10:00  Participation Level: Active  Behavioral Response: CasualAlertDepressed  Type of Therapy: Group Therapy  Treatment Goals addressed: Coping  Progress Towards Goals: Initial  Interventions: CBT, DBT, Supportive, and Reframing  Summary: Laura Simpson is a 48 y.o. female who presents with depression symptoms. Clinician led check-in regarding current stressors and situation, and review of patient completed daily inventory. Clinician utilized active listening and empathetic response and validated patient emotions. Clinician facilitated processing group on pertinent issues.   Therapist Response: Patient arrived within time allowed. Patient rates her mood at a "4.5" on a scale of 1-10 with 10 being best. Pt states she feels "not great." Pt states she slept 9 hours and ate 2x. Pt reports she had an "not good" afternoon. She reports she forgot her meds on Sunday and feels this  has affected her. She shares she had a "maybe anxiety attack" and laid down after group. She cried, and shares she had some hopeless thoughts. She shares the video OT Rockne Chyle shared was somewhat triggering due to the video sharing what people can do to help themselves when they are severely depressed and unable to perform at "normal." She shares it reminded her of how bad off she was not long ago. Pt able to process. Pt engaged in discussion.     Session Time: 10:00 am - 11:00 am   Participation Level: Active   Behavioral Response: CasualAlertDepressed   Type of Therapy: Group Therapy   Treatment Goals addressed: Coping   Progress Towards Goals: Progressing   Interventions: CBT, DBT, Solution Focused, Strength-based, Supportive, and Reframing   Summary: Group spent time discussing how to build a support system. Group spent time discussing motivation, redefining certain definitions (ex: "productive member of society"), and how to tackle tasks without black/white thinking that the whole project/task must be done at once.   Therapist Response:  Pt engaged in discussion and identifies being in need of additional support in her network. 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       Session Time: 11:00 -12:00   Participation Level: Active   Behavioral Response: CasualAlertDepressed   Type of Therapy: Group Therapy   Treatment Goals addressed: Coping   Progress Towards Goals: Progressing   Interventions: CBT, DBT, Solution Focused, Strength-based, Supportive, and Reframing   Summary: Cln continued discussion on Positive Psychology. Group spent time discussing the 5 activities (exercise, meditation, positive journaling, 3 gratitudes, and random acts of kindness) that can be used to rewire a  brain to be more positive. Cln challenges each patient to identify one activity to try.   Therapist Response: Pt engaged in discussion. She reports she will try  writing 3 gratitudes each day.        Session Time: 12:00 -1:00   Participation Level: Active   Behavioral Response: CasualAlertDepressed   Type of Therapy: Group therapy   Treatment Goals addressed: Coping   Progress Towards Goals: Progressing   Interventions: OT group   Summary: 12:00 - 12:50: Occupational Therapy group with cln E. Hollan.  12:50 - 1:00 Clinician assessed for immediate needs, medication compliance and efficacy, and safety concerns.   Therapist Response: 12:00 - 12:50: Pt participated 12:50 - 1:00 pm: At check-out, patient reports no immediate concerns. Patient demonstrates progress as evidenced by continued engagement and responsiveness to treatment. Patient denies SI/HI/self-harm thoughts at the end of group.    Suicidal/Homicidal: Nowithout intent/plan  Plan: Pt will continue in PHP while working to decrease depression symptoms, increase daily functioning, increase ability to manage symptoms in a healthy manner.   Collaboration of Care: Medication Management AEB T Lewis, NP  Patient/Guardian was advised Release of Information must be obtained prior to any record release in order to collaborate their care with an outside provider. Patient/Guardian was advised if they have not already done so to contact the registration department to sign all necessary forms in order for us  to release information regarding their care.   Consent: Patient/Guardian gives verbal consent for treatment and assignment of benefits for services provided during this visit. Patient/Guardian expressed understanding and agreed to proceed.   Diagnosis: Severe episode of recurrent major depressive disorder, without psychotic features (HCC) [F33.2]    1. Severe episode of recurrent major depressive disorder, without psychotic features (HCC)   2. Generalized anxiety disorder       [name], [facility]

## 2024-03-05 NOTE — Psych (Signed)
 Virtual Visit via Video Note  I connected with Laura Simpson on 03/05/24 at  9:00 AM EDT by a video enabled telemedicine application and verified that I am speaking with the correct person using two identifiers.  Location: Patient: pt's home in [place], [place] Provider: clinical home office in [place], [place]   I discussed the limitations of evaluation and management by telemedicine and the availability of in person appointments. The patient expressed understanding and agreed to proceed.   I discussed the assessment and treatment plan with the patient. The patient was provided an opportunity to ask questions and all were answered. The patient agreed with the plan and demonstrated an understanding of the instructions.   The patient was advised to call back or seek an in-person evaluation if the symptoms worsen or if the condition fails to improve as anticipated.  I provided 240 minutes of non-face-to-face time during this encounter.   [name], LCSW    [facility] PHP THERAPIST PROGRESS NOTE  Laura Simpson [id number]   Session Time: 9:00 am - 10:00 am  Participation Level: Active  Behavioral Response: CasualAlertDepressed  Type of Therapy: Group Therapy  Treatment Goals addressed: Coping  Progress Towards Goals: Progressing  Interventions: CBT, DBT, Solution Focused, Strength-based, Supportive, and Reframing  Therapist Response: Clinician led check-in regarding current stressors and situation, and review of patient completed daily inventory. Clinician utilized active listening and empathetic response and validated patient emotions. Clinician facilitated processing group on pertinent issues.?   Summary: Patient arrived within time allowed. Patient rates her mood at a 6 on a scale of 1-10 with 10 being best. Pt reports things are going well overall. When asked about sleep and appetite, pt reports she slept 9 hours last night and ate 3 meals yesterday. Pt denied experiencing SI/SH  thoughts since last session. Pt able to process.?Pt engaged in discussion.?      Session Time: 10:00 am - 11:00 am  Participation Level: Active  Behavioral Response: CasualAlertDepressed  Type of Therapy: Group Therapy  Treatment Goals addressed: Coping  Progress Towards Goals: Progressing  Interventions: CBT, DBT, Solution Focused, Strength-based, Supportive, and Reframing  Therapist Response: Clinician continued processing group. Cln utilized CBT principles to inform discussion  Summary: Pt engaged in discussion. Pt reports she met with her case manager yesterday and was told that she met all of her goals and doesn't technically meet criteria for needing a case manager anymore. She states she feels selfish for being happy about it because she feels sad for the people who are worse off.    Session Time: 11:00 am - 12:00 pm  Participation Level: Active  Behavioral Response: CasualAlertDepressed  Type of Therapy: Group Therapy  Treatment Goals addressed: Coping  Progress Towards Goals: Progressing  Interventions: CBT, DBT, Solution Focused, Strength-based, Supportive, and Reframing  Therapist Response: Group viewed Ted Talk entitled "How To Not Take Things Personally" presented by Filbert Huff. Cln utilized CBT principles to inform discussion while pts were encouraged to share their response to the video. Cln asked each patient to share a time in which they took things personally and reframed the situation based on what was shared in the [place] Talk.  Summary: Pt engaged in discussion. She demonstrated good insight into the subject matter.   Session Time: 12:00 pm - 1:00 pm  Participation Level: Active  Behavioral Response: CasualAlertDepressed  Type of Therapy: Group Therapy  Treatment Goals addressed: Coping  Progress Towards Goals: Progressing  Interventions: CBT, DBT, Solution Focused, Strength-based, Supportive, and Reframing  Therapist Response: 12:00 -  12:50 pm: Group was led by occupational therapist, Rockne Chyle. 12:50 - 1:00 pm: Clinician led check-out. Clinician assessed for immediate needs, medication compliance and efficacy, and safety concerns?  Summary: 12:00 - 12:50 pm: Pt engaged and participated in discussion. 12:50 - 1:00 pm: At check-out, patient contracts for safety.?Patient demonstrates progress as evidenced by her continued engagement and by being receptive to treatment. Patient denies SI/HI/self-harm thoughts at the end of group and agrees to seek help should those thoughts/feelings occur.?   Suicidal/Homicidal: Nowithout intent/plan  Plan: ?Pt will continue in PHP and medication management while continuing to work on decreasing depression symptoms,?SI, and anxiety symptoms,?and increasing the ability to self manage symptoms.    Collaboration of Care: Other RN Armandina Bernard  Patient/Guardian was advised Release of Information must be obtained prior to any record release in order to collaborate their care with an outside provider. Patient/Guardian was advised if they have not already done so to contact the registration department to sign all necessary forms in order for us  to release information regarding their care.   Consent: Patient/Guardian gives verbal consent for treatment and assignment of benefits for services provided during this visit. Patient/Guardian expressed understanding and agreed to proceed.   Diagnosis: Severe episode of recurrent major depressive disorder, without psychotic features (HCC) [F33.2]    1. Severe episode of recurrent major depressive disorder, without psychotic features [facility])       [name], LCSW 03/05/2024

## 2024-03-06 ENCOUNTER — Ambulatory Visit (INDEPENDENT_AMBULATORY_CARE_PROVIDER_SITE_OTHER): Admitting: Licensed Clinical Social Worker

## 2024-03-06 DIAGNOSIS — F332 Major depressive disorder, recurrent severe without psychotic features: Secondary | ICD-10-CM | POA: Diagnosis not present

## 2024-03-06 DIAGNOSIS — F411 Generalized anxiety disorder: Secondary | ICD-10-CM

## 2024-03-09 ENCOUNTER — Ambulatory Visit (HOSPITAL_COMMUNITY): Admitting: Licensed Clinical Social Worker

## 2024-03-09 DIAGNOSIS — F332 Major depressive disorder, recurrent severe without psychotic features: Secondary | ICD-10-CM

## 2024-03-10 ENCOUNTER — Telehealth (HOSPITAL_COMMUNITY): Payer: Self-pay | Admitting: Licensed Clinical Social Worker

## 2024-03-10 ENCOUNTER — Encounter (HOSPITAL_COMMUNITY): Payer: Self-pay

## 2024-03-10 ENCOUNTER — Ambulatory Visit (HOSPITAL_COMMUNITY)

## 2024-03-10 NOTE — Telephone Encounter (Signed)
 Pt called to discuss potentially discharging from PHP. She states she is having a physical flare up and is struggling mentally since being triggered last week in group. Cln asks if she was triggered by a specific group member and pt responds "yes and no" and reports that after two weeks of group, it is becoming too much. She expresses concern that her therapist will think she did not try hard enough if she only did two weeks of PHP instead of three. She also states that one group member who monopolized the group sessions, provided unsolicited advice, was labile, and talked over group leaders made group difficult for pt. Cln informed pt that the aforementioned group member will not be returning after today if that helps to inform her decision. Cln expressed support for pt to discharge from group if she chooses to and encouraged her to think about it given that the dynamic will be changing. Pt agrees to rest today and reassess tomorrow. Cln also advised that PHP is two to three weeks and the full three weeks is not required, and it's okay if two weeks was enough for pt. Cln expressed concern that pt feels worried about how her therapist will respond and offered to provide referrals for other therapists. Pt stated she will consider it. Pt denies SI.

## 2024-03-11 ENCOUNTER — Ambulatory Visit (HOSPITAL_COMMUNITY)

## 2024-03-11 ENCOUNTER — Encounter (HOSPITAL_COMMUNITY): Payer: Self-pay

## 2024-03-12 ENCOUNTER — Ambulatory Visit (HOSPITAL_COMMUNITY): Admitting: Licensed Clinical Social Worker

## 2024-03-12 DIAGNOSIS — F332 Major depressive disorder, recurrent severe without psychotic features: Secondary | ICD-10-CM

## 2024-03-13 ENCOUNTER — Ambulatory Visit (HOSPITAL_COMMUNITY)

## 2024-03-30 NOTE — Psych (Signed)
 Pt on schedule in error. Pt absent.

## 2024-03-30 NOTE — Psych (Signed)
 Pt on PHP schedule in error - Pt is absent.

## 2024-03-30 NOTE — Psych (Signed)
 Virtual Visit via Video Note  I connected with Laura Simpson on 03/06/24 at  9:00 AM EDT by a video enabled telemedicine application and verified that I am speaking with the correct person using two identifiers.  Location: Patient: patient home Provider: clinical home office   I discussed the limitations of evaluation and management by telemedicine and the availability of in person appointments. The patient expressed understanding and agreed to proceed.  I discussed the assessment and treatment plan with the patient. The patient was provided an opportunity to ask questions and all were answered. The patient agreed with the plan and demonstrated an understanding of the instructions.   The patient was advised to call back or seek an in-person evaluation if the symptoms worsen or if the condition fails to improve as anticipated.  Pt was provided 240 minutes of non-face-to-face time during this encounter.   Randall Bastos, LCSW   [facility] PHP THERAPIST PROGRESS NOTE  Laura Simpson [id number]  Session Time: 9:00 - 10:00  Participation Level: Active  Behavioral Response: CasualAlertDepressed  Type of Therapy: Group Therapy  Treatment Goals addressed: Coping  Progress Towards Goals: Progressing  Interventions: CBT, DBT, Supportive, and Reframing  Summary: Laura Simpson is a 48 y.o. female who presents with depression symptoms.  Clinician led check-in regarding current stressors and situation, and review of patient completed daily inventory. Clinician utilized active listening and empathetic response and validated patient emotions. Clinician facilitated processing group on pertinent issues.   Therapist Response: Patient arrived within time allowed. Patient rates her mood at a 7 on a scale of 1-10 with 10 being best. Pt states she feels okay. Pt states she slept 9 hours and ate 3x. Pt reports she has completed her movement 3x/week goal, 1x so far this week and is anxious about completing  the other 2x. Pt open to support and encouragement from group. Pt states she took a nap yesterday and couldn't shake feeling tired. Pt states she has missed her night medications two days in a row and is stressed because she knows that is not good. Pt reports willingness to set an alarm and not go to bed until she has taken it in order to overcome barriers. Pt reports struggling with labile mood. Pt able to process. Pt engaged in discussion.           Session Time: 10:00 am - 11:00 am   Participation Level: Active   Behavioral Response: CasualAlertDepressed   Type of Therapy: Group Therapy   Treatment Goals addressed: Coping   Progress Towards Goals: Progressing   Interventions: CBT, DBT, Solution Focused, Strength-based, Supportive, and Reframing   Summary: Cln led processing group for pt's current struggles. Group members shared stressors and provided support and feedback. Cln brought in topics of boundaries, healthy relationships, and unhealthy thought processes to inform discussion.    Therapist Response:  Pt able to process and provide support to group.           Session Time: 11:00 -12:00   Participation Level: Active   Behavioral Response: CasualAlertDepressed   Type of Therapy: Group Therapy   Treatment Goals addressed: Coping   Progress Towards Goals: Progressing   Interventions: CBT, DBT, Solution Focused, Strength-based, Supportive, and Reframing   Summary: Cln led discussion on ways to manage stressors and feelings over the weekend. Group members  brainstormed things to do over the weekend for multiple levels of energy, access, and moods. Cln reviewed crisis services should they be needed and provided pt's with the  text crisis line, mobile crisis, national suicide hotline, [facility] 24/7 line, and information on [facility].      Therapist Response: Pt engaged in discussion and is able to identify 3 ideas of what to do over the weekend to keep their mind engaged.             Session Time: 12:00 -1:00   Participation Level: Active   Behavioral Response: CasualAlertDepressed   Type of Therapy: Group therapy   Treatment Goals addressed: Coping   Progress Towards Goals: Progressing   Interventions: OT group   Summary: 12:00 - 12:50: Occupational Therapy group with cln E. Hollan.  12:50 - 1:00 Clinician assessed for immediate needs, medication compliance and efficacy, and safety concerns.   Therapist Response: 12:00 - 12:50: Pt participated 12:50 - 1:00 pm: At check-out, patient reports no immediate concerns. Patient demonstrates progress as evidenced by continued engagement and responsiveness to treatment. Patient denies SI/HI/self-harm thoughts at the end of group.    Suicidal/Homicidal: Nowithout intent/plan  Plan: Pt will continue in PHP while working to decrease depression symptoms, increase daily functioning, increase ability to manage symptoms in a healthy manner.   Collaboration of Care: Medication Management AEB T Lewis, NP  Patient/Guardian was advised Release of Information must be obtained prior to any record release in order to collaborate their care with an outside provider. Patient/Guardian was advised if they have not already done so to contact the registration department to sign all necessary forms in order for us  to release information regarding their care.   Consent: Patient/Guardian gives verbal consent for treatment and assignment of benefits for services provided during this visit. Patient/Guardian expressed understanding and agreed to proceed.   Diagnosis: Severe episode of recurrent major depressive disorder, without psychotic features (HCC) [F33.2]    1. Severe episode of recurrent major depressive disorder, without psychotic features (HCC)   2. Generalized anxiety disorder       Randall Bastos, LCSW

## 2024-06-25 ENCOUNTER — Emergency Department (HOSPITAL_BASED_OUTPATIENT_CLINIC_OR_DEPARTMENT_OTHER): Admission: EM | Admit: 2024-06-25 | Discharge: 2024-06-25 | Disposition: A | Discharge status: Home / Self Care

## 2024-06-25 ENCOUNTER — Other Ambulatory Visit: Payer: Self-pay

## 2024-06-25 ENCOUNTER — Encounter (HOSPITAL_BASED_OUTPATIENT_CLINIC_OR_DEPARTMENT_OTHER): Payer: Self-pay

## 2024-06-25 ENCOUNTER — Emergency Department (HOSPITAL_BASED_OUTPATIENT_CLINIC_OR_DEPARTMENT_OTHER)

## 2024-06-25 DIAGNOSIS — B9689 Other specified bacterial agents as the cause of diseases classified elsewhere: Secondary | ICD-10-CM | POA: Insufficient documentation

## 2024-06-25 DIAGNOSIS — M791 Myalgia, unspecified site: Secondary | ICD-10-CM | POA: Diagnosis present

## 2024-06-25 DIAGNOSIS — N760 Acute vaginitis: Secondary | ICD-10-CM | POA: Insufficient documentation

## 2024-06-25 LAB — COMPREHENSIVE METABOLIC PANEL WITH GFR
ALT: 19 U/L (ref 0–44)
AST: 23 U/L (ref 15–41)
Albumin: 4.5 g/dL (ref 3.5–5.0)
Alkaline Phosphatase: 82 U/L (ref 38–126)
Anion gap: 10 (ref 5–15)
BUN: 9 mg/dL (ref 6–20)
CO2: 24 mmol/L (ref 22–32)
Calcium: 9.2 mg/dL (ref 8.9–10.3)
Chloride: 103 mmol/L (ref 98–111)
Creatinine, Ser: 0.82 mg/dL (ref 0.44–1.00)
GFR, Estimated: >60 mL/min (ref >60)
Glucose, Bld: 112 mg/dL — ABNORMAL HIGH (ref 70–99)
Potassium: 4 mmol/L (ref 3.5–5.1)
Sodium: 137 mmol/L (ref 135–145)
Total Bilirubin: 0.3 mg/dL (ref 0.0–1.2)
Total Protein: 7.3 g/dL (ref 6.5–8.1)

## 2024-06-25 LAB — URINALYSIS, W/ REFLEX TO CULTURE (INFECTION SUSPECTED)
Bilirubin Urine: NEGATIVE
Glucose, UA: NEGATIVE mg/dL
Hgb urine dipstick: NEGATIVE
Ketones, ur: NEGATIVE mg/dL
Leukocytes,Ua: NEGATIVE
Nitrite: NEGATIVE
Protein, ur: NEGATIVE mg/dL
Specific Gravity, Urine: <=1.005 (ref 1.005–1.030)
pH: 5.5 (ref 5.0–8.0)

## 2024-06-25 LAB — WET PREP, GENITAL
Sperm: NONE SEEN
Trich, Wet Prep: NONE SEEN
WBC, Wet Prep HPF POC: <10 (ref <10)
Yeast Wet Prep HPF POC: NONE SEEN

## 2024-06-25 LAB — CBC
HCT: 43.8 % (ref 36.0–46.0)
Hemoglobin: 14.8 g/dL (ref 12.0–15.0)
MCH: 32 pg (ref 26.0–34.0)
MCHC: 33.8 g/dL (ref 30.0–36.0)
MCV: 94.6 fL (ref 80.0–100.0)
Platelets: 279 K/uL (ref 150–400)
RBC: 4.63 MIL/uL (ref 3.87–5.11)
RDW: 12.7 % (ref 11.5–15.5)
WBC: 7.5 K/uL (ref 4.0–10.5)
nRBC: 0 % (ref 0.0–0.2)

## 2024-06-25 LAB — LACTIC ACID, PLASMA: Lactic Acid, Venous: 1.5 mmol/L (ref 0.5–1.9)

## 2024-06-25 LAB — RESP PANEL BY RT-PCR (RSV, FLU A&B, COVID)  RVPGX2
Influenza A by PCR: NEGATIVE
Influenza B by PCR: NEGATIVE
Resp Syncytial Virus by PCR: NEGATIVE
SARS Coronavirus 2 by RT PCR: NEGATIVE

## 2024-06-25 LAB — LIPASE, BLOOD: Lipase: 37 U/L (ref 11–51)

## 2024-06-25 MED ORDER — LACTATED RINGERS IV BOLUS
1000.0000 mL | Freq: Once | INTRAVENOUS | Status: AC
  Administered 2024-06-25: 1000 mL via INTRAVENOUS

## 2024-06-25 MED ORDER — METRONIDAZOLE 500 MG PO TABS: 500.0000 mg | ORAL_TABLET | Freq: Two times a day (BID) | ORAL | 0 refills | Status: AC

## 2024-06-25 NOTE — Discharge Instructions (Signed)
 Please follow-up with your primary doctor.  Your vital signs, physical exam and lab work are reassuring.  It does appear that you have bacterial vaginosis.  We are prescribing antibiotics.  Please take it for the full course.  Try to maintain hydration with frequent sips of clears and fluids.  Return if you develop sudden onset headache, vision loss, facial droop, chest pain, shortness of breath, passout, severe abdominal pain or any new or worsening symptoms that are concerning to you.

## 2024-06-25 NOTE — ED Notes (Signed)
 X-ray at bedside.

## 2024-06-25 NOTE — ED Notes (Addendum)
 Pt ambulatory to bathroom, shuffling gait, standby assistance.

## 2024-06-25 NOTE — ED Triage Notes (Signed)
 Reports nausea, diaphoresis, fatigue, headache since yesterday. Denies fevers, cough, congestion   States took buspirone, sumatriptan and duloxetine within 6 hours yesterday. - nausea, lightheaded, shakiness   Also reports vaginal tear after intercourse on Saturday. Reports vaginal bleeding and dysuria since.  Also reports a bump in R ear for 3 weeks. Denies difficulty hearing, denies pain to ear.

## 2024-06-25 NOTE — ED Provider Notes (Signed)
 [facility] Provider Note   CSN: [id number] Arrival date & time: 06/25/24  1228     Patient presents with: multiple complaints   Ijeoma Loor is a 48 y.o. female.   This is a 48 year old female presenting emergency department with numerous complaints.  Primarily complaining of generalized malaise, body aches and feeling unwell.  Complains of bleeding after intercourse on Saturday, small amount noted into her underwear, then has had some spotting when she wipes after urination.  No fevers no chills.  No abdominal pain.  Does have some dysuria.  Reports she had a headache yesterday and took her sumatriptan and which is made her feel nauseated, had some episodes of chills and diaphoresis.  Also complaining of a bump to her right ear canal x 3 weeks.  No pain difficulty hearing no discharge.        Prior to Admission medications   Medication Sig Start Date End Date Taking? Authorizing Provider  acetaZOLAMIDE (DIAMOX) 250 MG tablet Take 1 tablet by mouth 2 (two) times daily. 02/20/24   [provider]  albuterol  (VENTOLIN  HFA) 108 (90 Base) MCG/ACT inhaler Inhale 2 puffs into the lungs every 4 (four) hours as needed for wheezing. 09/27/15   [provider]  amoxicillin -clavulanate (AUGMENTIN ) 875-125 MG tablet Take 1 tablet by mouth every 12 (twelve) hours. 11/17/23   Jerral Meth, MD  busPIRone (BUSPAR) 5 MG tablet Take 5 mg by mouth 2 (two) times daily. Takes 2.5mg  BID will taper soon 02/07/24   [provider]  cetirizine (ZYRTEC) 10 MG tablet Take 10 mg by mouth daily.    [provider]  DULoxetine (CYMBALTA) 60 MG capsule Take 60 mg by mouth daily. 06/11/22   [provider]  famotidine  (PEPCID ) 20 MG tablet Take 20 mg by mouth daily. Taking prn    [provider]  ferrous sulfate 325 (65 FE) MG tablet Take 325 mg by mouth daily with breakfast.    [provider]  ivabradine  (CORLANOR) 5 MG TABS tablet Take 2.5 mg by mouth 2 (two) times daily with a meal. 12/13/23 12/12/24  [provider]  LORazepam  (ATIVAN ) 1 MG tablet Take 1 mg by mouth 2 (two) times daily. Takes 1mg  am and 1.5mg  in the evening 09/05/22   [provider]  MAGNESIUM PO Take 400 mg by mouth daily.    [provider]  Multiple Vitamin (QUINTABS) TABS Take 1 tablet by mouth daily.    [provider]  omeprazole (PRILOSEC) 40 MG capsule Take 40 mg by mouth daily as needed (as needed). 10/12/22   [provider]  ondansetron  (ZOFRAN -ODT) 4 MG disintegrating tablet Take 4 mg by mouth every 8 (eight) hours as needed. 12/31/23   [provider]  SUMAtriptan (IMITREX) 50 MG tablet Take 100 mg by mouth every 2 (two) hours as needed for migraine (as needed for migraines). 09/17/14   [provider]  zolpidem (AMBIEN) 10 MG tablet Take 10 mg by mouth at bedtime as needed for sleep. 06/11/22   [provider]    Allergies: Benadryl  [diphenhydramine ], Diphenhydramine  hcl, Hydrocortisone, Klonopin [clonazepam], Topiramate, Trazodone, Trazodone and nefazodone, Gabapentin, Lamotrigine, and Pollen extract    Review of Systems  Updated Vital Signs BP 127/82   Pulse 91   Temp 98.7 F (37.1 C) (Oral)   Resp 19   SpO2 98%   Physical Exam Vitals and nursing note reviewed. Exam conducted with a chaperone present.  Constitutional:  General: She is not in acute distress.    Appearance: She is not toxic-appearing.  HENT:     Head: Normocephalic.     Nose: Nose normal.  Eyes:     Conjunctiva/sclera: Conjunctivae normal.  Cardiovascular:     Rate and Rhythm: Normal rate and regular rhythm.  Pulmonary:     Effort: Pulmonary effort is normal.     Breath sounds: Normal breath sounds.  Abdominal:     General: Abdomen is flat. There is no distension.     Palpations: Abdomen is soft.     Tenderness: There is no abdominal tenderness. There is no  guarding or rebound.  Genitourinary:    General: Normal vulva.     Rectum: Normal.     Comments: Do not appreciate vaginal bleeding or tear. Musculoskeletal:        General: Normal range of motion.  Skin:    General: Skin is warm and dry.     Capillary Refill: Capillary refill takes less than 2 seconds.  Neurological:     General: No focal deficit present.     Mental Status: She is alert and oriented to person, place, and time.  Psychiatric:        Mood and Affect: Mood normal.        Behavior: Behavior normal.     (all labs ordered are listed, but only abnormal results are displayed) Labs Reviewed  WET PREP, GENITAL - Abnormal; Notable for the following components:      Result Value   Clue Cells Wet Prep HPF POC PRESENT (*)    All other components within normal limits  COMPREHENSIVE METABOLIC PANEL WITH GFR - Abnormal; Notable for the following components:   Glucose, Bld 112 (*)    All other components within normal limits  URINALYSIS, W/ REFLEX TO CULTURE (INFECTION SUSPECTED) - Abnormal; Notable for the following components:   Bacteria, UA RARE (*)    All other components within normal limits  RESP PANEL BY RT-PCR (RSV, FLU A&B, COVID)  [id number]  CBC  LIPASE, BLOOD  LACTIC ACID, PLASMA    EKG: EKG Interpretation Date/Time:  Thursday June 25 2024 12:40:22 EDT Ventricular Rate:  105 PR Interval:  131 QRS Duration:  85 QT Interval:  331 QTC Calculation: 438 R Axis:   66  Text Interpretation: Sinus tachycardia Consider right atrial enlargement Confirmed by Neysa Clap [phone]) on 06/25/2024 1:20:35 PM  Radiology: DG Chest Portable 1 View Result Date: 06/25/2024 CLINICAL DATA:  Nausea with diaphoresis, fatigue and headache since yesterday. EXAM: PORTABLE CHEST 1 VIEW COMPARISON:  01/01/2024 FINDINGS: Lungs are adequately inflated without focal airspace consolidation or effusion. Cardiomediastinal silhouette and remainder the exam is unchanged. IMPRESSION: No active  disease. Electronically Signed   By: Toribio Agreste M.D.   On: 06/25/2024 14:20     Procedures   Medications Ordered in the ED  lactated ringers  bolus 1,000 mL (1,000 mLs Intravenous New Bag/Given 06/25/24 1356)    Clinical Course as of 06/25/24 1543  Thu Jun 25, 2024  1534 Clue Cells Wet Prep HPF POC(!): PRESENT [TY]    Clinical Course User Index [TY] Neysa Clap PARAS, DO                                 Medical Decision Making This is a 48 year old female presenting emergency department with multiple complaints.  She is afebrile, mildly elevated heart rate on arrival improved with  IV fluids.  Normotensive.  Maintaining oxygen saturation on room air.  Not having focal neurodeficits.  In terms of her vaginal bleeding, she reports scant at this point with when she wipes.  Do not appreciate obvious vaginal tears or trauma on my physical exam.  UA not consistent with urinary tract infection.  She does have clue cells present will need antibiotics.  Her generalized malaise I suspect secondary to possible viral etiology.  She has no fever, chills, no white count to suggest systemic infection.  No anemia that explain her fatigue and generalized malaise.  flu/COVID/RSV was negative.  She has normal kidney function.  No transaminitis to suggest hepatobiliary disease.  Lipase is normal. Pancreatitis unlikely.  Lactate not elevated.  Is ambulatory to the bathroom with steady gait.  Focal neurologic deficits that would suggest acute intracranial pathology.  Her ear exam normal.  Do not appreciate otitis externa or interna. Discussed follow-up with her primary doctor.  Will discharge in stable condition.  Amount and/or Complexity of Data Reviewed Labs: ordered. Decision-making details documented in ED Course. Radiology: ordered. ECG/medicine tests: ordered.      Final diagnoses:  None    ED Discharge Orders     None          Neysa Caron PARAS, DO 06/25/24 1543

## 2024-07-13 NOTE — Progress Notes (Signed)
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 has not done well in the past we tried getting her off of it She has had trialed some other SGA's that she did not tolerate quite as well She is pleasant during today's assessment Ambien and lorazepam  as needed on rare occasion though it appears she had not picked up the Ambien since back in November according to controlled database and she does not require the lorazepam  very frequently Though she is taking the buspirone and the Cymbalta and Abilify consistently.  01/31/2023 Laura Simpson has been feeling overwhelmed stress going through divorce process having a tough time leaving the house without getting tearful That she had some health issues had been seen by neurology. Changes try and adjust the antidepressant had maxed out the Cymbalta, but she was still having a tough time she also has some cognitive issues at times she has had trials of several SGA's and we had increased the Abilify at the last visit But in the interim she cannot really tell that she is doing any better still having a hard time with depression Did she see neurology was reportedly having some jerking movements they were thinking her myoclonic jerks might be related to multiple serotonergic agents and had her stop her  trazodone Not observing jerking during the video visit today But I discussed with her that occasionally things like buspirone or Cymbalta could cause some muscle twitching or jerking or occasional mild hand tremors but usually this is very mild, so I do not know that it is related to her problem at all And am not observing tremors But once again we talked about possibly even just stopping the Abilify since it sounds like neurology was concerned it might be adding to the tremor issue  03/04/2023 Laura Simpson seen for follow-up med management At the last visit I had started her on lamotrigine, replacing her Abilify due to concerns she was having some possible jerking motions at times I was not really observing tremors but still due to concerns I had taken her off but she been seen by neurology for assessment Unfortunately she developed a rash on her trunk with the lamotrigine and ended up having to stop it after just about 3 days In the meantime though she just felt like some of her medicine is not working as well she went to the emergency room shortly after she had seen me while she was visiting [place] , had severe migraines some slurred speech,, and it was recommended she come back for MRI but did not follow-up with that But since that time she is still having a little bit of slurred speech, She been taking her lorazepam  a little more frequently due to her anxiety but she had felt like her medicines just had been working as well so she weaned herself down on Cymbalta so she is only taking 60 mg now She had weaned off of the BuSpar, she has been taking the lorazepam  a little more frequently Some nights she is taking 20 mg Ambien instead of just him because she is also had a harder time sleeping She is pleasant during the assessment She also had neuropsych testing with  Dr. Marykay back in May and it felt she did not have a major cognitive issue though he could not rule out temporary episodes in which her  cognition might worsen But felt there was a psychological component And then a course she is also experiencing conversion symptoms as well  She is pleasant during today's assessment Admits she still having a hard time with the depression some general anxiety and then more disrupted sleep  03/26/2023 Laura Simpson has been through a tough time the last few months, We had her come off of several medicines due to possible reactions including lamotrigine which she unfortunate developed a rash with Abilify was having some jerking motions and then she had weaned off her BuSpar and cut down on her Cymbalta feeling they just were not working so well She also has neuropsych testing back in May 2024 is felt that she did not have a major cognitive issue but that she might have some temporary episodes in which her cognition might be worse Feeling that was a psychological point component She is also dealt with a chronic disrupted sleep and anxiety At the last visit we had her come off the Cymbalta the rest of the way since she tried to cut down on it she has had a little bit of dizziness since cutting back and coming off of it is only been about 3 weeks usually just a side effect goes away fairly quickly I started her on nortriptyline at night in part for mood and also to help with some of her neurologic symptoms But and hoping it might help with sleep but it sounds like her sleep was more disrupted if anything so she moved it to morning time she is not feeling tired through the day She just increased it to 50 mg this past week So the dose is still on the low side She is having a harder time with sleep noting she just does not rest well and she takes 2 of the Ambien or either 2 of the lorazepam  at night and sometimes may take a third 1 if she still has trouble getting to sleep half hour to an hour later and then she seems to rest much better after that She is also been alternating between the 2's and so I think that  may impact her disrupted sleep to a degree as well Pleasant during the assessment though admits some continued anxiety and mild depression.  05/07/2023 Laura Simpson has been a tough time She is quite pleasant today She has had trials of several medicines which she did not tolerate due to various side effects And then had come off Cymbalta, She been on nortriptyline and initially felt like it was making a difference and so we had continued that at the last visit were hoping that might help with some of her neurologic symptoms also In the interim though she is contacted the office asking about coming off nortriptyline going back to Cymbalta She subsequently stopped the nortriptyline but held off on starting Cymbalta and overall she is felt less anxious she feels like she is turned the corner now she is taking the Ambien as needed for sleep not every night and then also lorazepam  as needed but not required it consistently and feels that these 2 in combination seem to be working fairly well for her. She would like to stay off the antidepressant a little longer to see how she continues going since she is felt better overall.  06/12/2023 [place] she had a trip  to the beach recently and had a terrible time there Had a panic attack for 1 thing and then took a lorazepam  and that seemed to help with the panic symptoms but that she felt her over the next day She continues having some odd symptoms some dissociative type symptoms at times some problems with focus concentration and word finding at times A lot of ruminative thinking, she seems to have heightened awareness of physical symptoms, Has had a hard time turning her mind off Today she brought in some notes with her because she is worried that she would not remember to tell me things She had come off Cymbalta she has been tried on several antidepressants and dopamine blockers as well she is cut back on the Ambien so she is taking it infrequently now she also is  rarely taking lorazepam  She had reconciled with her spouse,, but had been involved with someone else and had terminated that relationship She is starting counseling soon She is pleasant during the assessment, but it sounds like she has a lot of ruminative anxiety she is experiencing a lot of pseudo neurologic type symptoms, She has been followed by neurology and reports they feel like a lot of what she is experiencing may be more psychological than neurologic  07/16/2023 Avrie has been to the hospital several times since she last saw me and sounds like she was treated on the medical floor followed by psychiatry She had a tough time since back during the summer when she went to the beach, going for several months she has had a number of pseudo neurologic type symptoms, She is recently started counseling She has had trials of a number of different agents, and she was concerned she might be having withdrawal from lorazepam  and had presented thinking about getting detox, she is only taking 1 mg most days, 90 neurologic symptoms underwent evaluation, 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 Her voice was a little tremulous she still appears somewhat anxious during the visit she is having a hard time with sleep She been taking the Ambien at night and she often breaks the lorazepam  tablets in the pieces taking part of it through the day She feels like it does not last very long and that has not been quite as effective as it had been previously She is somewhat anxious about taking medicine, and then she has had some reactions with many things she been tried on number of which I think are probably more anxiety related than actual side effects sometimes Reviewed collateral from the recent emergency room  visits and his last hospitalization  08/13/2023 Describing a number of neurologic type symptoms, tremulous walking, voice stuttering some Tried valium but ended up back on ativan  Taking 1mg  am and 1.25mg  pm Taking cymbalta 30mg   May take ambien if wakes up during night Neurology, also had seen cardiology recently, and the pattern of the different physical symptoms she is experienced is more suggestive of conversion type symptoms. She is pleasant during the assessment I was hoping the Valium might have more of a right muscle relaxing effect to help in particular with these weird tremor she is experience but once again she did not tolerate it very well ended up back on the Ativan   09/03/2023 Kelli has been having a number of neurologic type symptoms some tremulousness difficulty with walking even voice stuttering and at the last visit I had increased her Cymbalta to 40 mg, I wanted to increase it further but was going slowly due to her anxiety about medicine in general She still probably under taking her lorazepam  But she admits she has been having continued tremors and they have been a little worse the last week in part she had hurt her back and then had some other symptoms had to be seen by family medicine so she not felt as well physically the last week or so either She is tolerated the increase in the Cymbalta dose to 40 mg, but cannot tell a huge difference since the dose was changed and she still finds her self getting tearful easily staying depressed in part dealing with these physical symptoms. pleasant during the visit related well, But her voice was tremulous  09/27/2023 Ijanae has dealt with a number of neurologic symptoms, tremulousness voice stuttering difficulty walking 1 point and more recently she has been diagnosed with dysautonomia she has been referred to a clinic for POTS but there is a long waiting list for that particular clinic She feels like her anxiety is doing some better  since we increase her Cymbalta to 60 mg though is hard to know exactly how much better she is not getting tearful is easily dealing with her physical morbidity and she appears calmer today overall she continues taking the lorazepam  as needed she is also taking the Ambien at night She is tolerated the Cymbalta 60 mg would like to leave it at this dose for another month She also like to explore gradually cutting back on lorazepam  again she had a hard time previously tried to come off of benzodiazepines and were talking very gradual titration even But she would like to maybe cut back by as little as an 1/8 mg at a time with the lorazepam  no she has a pill cutter so we talked about several options 1 would be even sending the medicine to a compounding pharmacy but she did not know if her Medicaid would cover something like that  10/25/23/  Laura Simpson is brighter overall today. She has experienced some conversion type symptoms and was having stuttering difficulty walking at 1 point was diagnosed with dysautonomia, We had increased her Cymbalta and it seems like it helped some she is not getting tearful is easily is a little less anxious dealing with the physical morbidity she continues taking lorazepam  and the Ambien is resting well She admits there are some continued anxiety and she is cut a half in half about whether she might want to try increasing the Cymbalta We also talked about the possibility of BuSpar but think it makes more sense to increase Cymbalta further She still experiencing the cognitive issues and some problems with her memory at times But she is feeling much more comfortable overall.  11/22/2023 Laura Simpson is tearful bit anxious today, She is in the hospital with a sinus infection, she also had a lumbar puncture due to concerns about ruling out possible meningitis since she had some altered mental status Worried about her cognitive functioning had had some dissociative episodes and I think a lot  of this is probably conversion related She is also worried whether she might be psychotic or delirious, and I discussed with her I did not think she had underlying thought disorder as per the delirium I doubt that it is delirium  currently quite lucid at the moment that does not mean she might not the having some confusion at other times but it does not sound like delirium Overall she admits that she had started to feel better so she did not go up on the Cymbalta as we discussed the last visit but she would like to try going up on it now She was recently switched to Omnicef she has not been taking that for 7 days anticipating possible release from the hospital tomorrow  12/11/2023 Pt up to 80mg  cymbalta, seems to be for the last few days has tolerated it so far she was want to stop the Cymbalta gradually see if she might tolerate it better so she can get to a higher dose Dealing some with the depression and anxiety as well as fatigue at times  Still dealing with the sinus infection, still on antibiotic A little bit of add sensation in head in afternoons, not sure Slight neurology gave her a low-dose of olanzapine for breakthrough migraines which she is to take as needed but she has not actually tried it she is stuck with her triptan, She asked about possibly going back to Abilify or whether it seemed to have energy boosting effect when she took it and I cannot recall and looking through the records, but she had been on several SGA's and 1 reason we moved away from Abilify was because of her tremors and not knowing if they may have been contributing to it Once again she still experiencing depression and anxiety but appears brighter overall today.  01/08/2024 Bellany continues having some anxiety mood symptoms but she is also had some twitching at times that note that may be related to the Cymbalta increase, She tried getting up to a higher dose and did not tolerate it so we backed her down to 70 cc be  tolerating that quite well She is on a low-dose of olanzapine which could cause some movement issues though rare with olanzapine at the 5 mg dose but she is not actually taking it it was given by neurology for nausea with her migraines, She is still dealing with some anxiety she is back down on her lorazepam  she is now taking 1 mg in the morning and 1.25 mg in the afternoons Would like to try getting down further and maybe reintroducing BuSpar at some point   02/07/2024 Laura Simpson today She continues dealing with the chronic tremors she has had some twitching at times She tried increasing the Cymbalta just did not do well with the higher dose and so she subsequently backed it down to 60 mg now and seems to be tolerating that okay she was giving a low-dose of a olanzapine at 1 point but by neurology for nausea with her migraines but she never took it due to anxiety about taking it She has been able to back down some on her lorazepam  again and would like to work toward getting off of the weather. She has been sleeping fairly well with Ambien as needed she takes it infrequently, She would like to try working down further as she is reduced the lorazepam  by as little as 1/8 of a tablet, and would like to try working down further but she would also like to try starting back on buspirone as she could tolerate it since it seemed to help her anxiety do better she still has some 5 mg size tablets left at home.  03/16/2024 Jniya is to be having a better day today,  but she admits last week was a tough week for her all week long she ended up going to the emergency room on June 12, with headache and dizziness feeling shaky, She had been trying to come off 1 medicine she been on previously and cut back on it very gradually, She had also tried increasing her BuSpar she had only been taking half of a 5 mg tablet and recently increased it to half twice a day, And then she been involved in a partial hospital or intensive  outpatient treatment program Once again she seems to be feeling better today, She is trying to figure out what may have account for her suddenly feeling worse again .  She did not know if perhaps some of it may have been the dysautonomia She is currently taking lorazepam  she is tried backing down on it but the lorazepam  has seemed to work well for her or at least partially, she is currently taking 1 mg during the day and then 1 mg and then 1/8 of a tablet at night And then she had back down on the BuSpar is currently taking half of the 5 mg tablet she would like to leave it there for now.  04/07/2024 Laura Simpson admits some incr anxiety, may have overdone last couple weeks, some incr in tremors, spouse also had to go to er with health issue, which stressed her as well Some increase in her hand tremors since has been more anxious and she can also get a little more down or tearful at times to and I think it is partly the stress in the anxiety you worrying about things She still only taking a very low-dose of the BuSpar taking 2-1/2 mg twice daily, And then had been cutting back on the lorazepam  but asked about possibly increasing her morning dose a little bit She was concerned whether going up on the dose might get it to work for a while and then it was stopped again potentially or she might develop tolerance She is pleasant during the assessment Her voice is tremulous  she relates well to the examiner.  05/11/2024 Pt notes Ive been doing good.  Sounding much brighter today, She feels like she is finally adjusted to some of the medicine changes But she has been much more active her husband has been out of work for a while, so he has been home to help out some and she is also being going some places in public with him but is felt more like doing more She feels her spiritual faith is part of what helped her but she also feels like she is adjusted to the medicine She continues taking Cymbalta 60 mg  daily Taking BuSpar 5 mg twice daily by breaking the tablets in half and then is taking the lorazepam  She has tried cutting back on its but feels fairly comfortable where she is at right now most days she takes 1 mg in the morning and then 1 mg and 1/4 tablets at night Her mood is good anxiety appears to be manageable.  She is certainly sounds calm her looks brighter overall during today's assessment  07/13/2024 Laura Simpson is in good spirits today, She has finally turned the corner in the last few months after struggling with depression and anxiety a lot of physical symptoms, worrying excessively about things getting tearful easily having tremors But this is diminished significantly She feels like she is back to her old self finally She still has the occasional down day but  not as severe as what she had experienced previously and that she knows she expect she will have a down day once in a while. But overall she has felt better she has been more active, she continues taking Cymbalta 60 mg daily taking BuSpar very low-dose, 2.5 mg twice daily, and then taking lorazepam  1 mg in the morning and usually 1-1/4 mg in the evenings She is not endorsing any adverse side effects Not endorsing any complaints today.    Prev paxil years ago celexa several mos, hard to tell if helpful adderall , not helpuful, more anxious Namenda did not really seem to impact cognitive functioning Klonopin was helpful for anxiety, but had come off due to possible impact on cognition, had severe discontinuation symptoms even with a very slow weaning process Stimulant ineffective for memory\ Seroquel was too sedating Had offered Geodon but she ended up going back to Abilify  Ambien partially helpful but was still waking up in the middle of the night  Also.  She had a repeat neuropsych testing in September 19, 2021 She had had previous testing in 2021 And the results of the test did not show any major neurocognitive  challenges. Been dealing with possible SVT and also been started on a beta-blocker at 1 point but notes that the cardiac workup ended up being negative and she was able to come off the beta-blocker and has done fine without it Takes the lorazepam  and the Ambien infrequently Zoloft possible reaction Hydroxyzine, felt bp went up  pmh Patient Active Problem List  Diagnosis   Major depressive disorder, recurrent, mild   Adjustment disorder with mixed anxiety and depressed mood   Generalized anxiety disorder   Primary insomnia   Inappropriate sinus tachycardia (CMD)   PSVT (paroxysmal supraventricular tachycardia) (CMD)   PVC's (premature ventricular contractions)   Major depressive disorder, recurrent severe without psychotic features    (CMD)   Cognitive complaints with normal neuropsychological exam   Asthma   Benign familial tremor   Chest pain syndrome   Esophageal reflux   Head trauma   History of benign ovarian tumor   Hyperlipidemia   Cognitive disorder   GAD (generalized anxiety disorder)   Vestibular migraine   Sedative, 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)   Impaired functional mobility, balance, gait, and endurance   Chronic sinusitis   Hypotension   Irritable colon   Numbness and tingling in both hands   Thyroid nodule   Conversion disorder   Dysautonomia    (CMD)   Dehydration   Major depressive disorder/anxiety   Requires daily assistance for activities of daily living (ADL) and comfort needs   Pulsatile tinnitus of left ear    Pers/soc No cd issues Reconciled with spouse, now going thru divorce  Psych etoh in paternal gf  Review of system Neuro positive for headache, negative for seizure Psych, pos for anxiety, negfor depression, neg for insomnia    Mental status exam MSE Appearance  appears stated age, well nourished, Well developed  and clean  Behavior  calm and cooperative Motor slight  tremor in hands, voice tremuloous Speech normal volume, normal rate and clear Mood euthymic  Affect  appropriate Thought process  coherent Thought content no SI, no hi, no delusions. 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 Marykay in May 2024 did not feel like she had a major cognitive issue but felt like there was a strong psychological component  Overall mood and anxiety have been manageable, not describing as much of the physical symptoms and feels like she is back to her old self now sleeping well Will continue with the current regimen no changes today   continue with the BuSpar 5 mg a half or 1 tablet up to twice daily she has been taking just a half tab twice daily  Continue taking lorazepam  1 mg, prescription will say that she could take 1.5 mg twice daily, but she has been taking 1 mg in the morning and 1-1/4 mg at night   continue with Cymbalta at 60 mg daily she did not tolerate the higher dose and she is gradually backed down on this   Reviewed potential side effects how to reach as needed  Return in 2 months   She is now seeing a counselor   I reviewed the [place]  controlled database 07/13/2024    Orders Placed This Encounter  Medications   busPIRone (BUSPAR) 5 mg tablet    Sig: Take 1/2 to 1  tab 2 times daily    Dispense:  60 tablet    Refill:  2   LORazepam  (Ativan ) 1 mg tablet    Sig: Take 1&1/2 tabs twice daily as needed    Dispense:  90 tablet    Refill:  2   DULoxetine (CYMBALTA) 60 mg capsule    Sig: Take 1 cap daily    Dispense:  30 capsule    Refill:  2

## 2024-08-13 NOTE — Progress Notes (Addendum)
 "  [facility] [place] [address] [place] [address] [phone]  Laura Simpson 24-Sep-1976 08/13/2024   Chief Complaint  Patient presents with   Weight Gain    Would like to discuss options to help with weight management. Has contacted insurance and for zepbound would approve for sleep apnea. Sleep study was completed 04/2023.    Numbness    Bilateral hands. Worse in AM with waking. Does notice at night when lying in bed reading. Does not wake at night.    thyroid nodules    Would like to discuss last thyroid imaging. Denies any trouble swallowing. Does occasionally have pain in throat but does  resolve quickly.     HPI:  History of Present Illness The patient presents for evaluation of fatigue, weight gain, hand numbness, thyroid nodules, and shortness of breath.  She reports persistent fatigue, which she attributes to her recent vacation where she overexerted herself. This has resulted in a lack of motivation and difficulty in rising from bed. She also experiences postprandial somnolence, particularly after breakfast. Her last sleep study was conducted on 04/2023. She has a history of tachycardia and was previously on duloxetine, which she discontinued last year. She recalls a significant weight gain of 80 pounds within a year when she resumed duloxetine. She has undergone a cortisol study and notes a slight elevation in her eosinophil count. She has a history of eosinophilic gastroenteritis diagnosed 14 years ago, which occasionally flares up. Despite multiple colonoscopies, no abnormalities have been detected.  She has been experiencing weight gain, which she finds perplexing given her dietary habits that include minimal snacking, infrequent consumption of fried foods, and a preference for water over other beverages. She consumes coffee in the morning and has been advised by her psychiatrist that her weight gain is not related to  her medication, duloxetine.  She experiences numbness in her hands, predominantly at night or when holding her tablet for reading. She has not sought treatment for this symptom but plans to discuss it with her cardiologist during her next appointment in 11/2024. She has been diagnosed with bone spurs and has an upcoming appointment with an orthopedic specialist on 09/08/2024 due to severe back pain radiating down her leg.  She has a history of thyroid nodules, initially on the right side, which were biopsied and found to be benign. However, she now has nodules on both sides and occasionally experiences pain under her chin.  She reports experiencing chest pain, shortness of breath, and a wheezing sound reminiscent of emphysema patients. She has been advised to lose weight to alleviate these symptoms. She has a history of fatty liver disease and sleep apnea, although it was not severe at the time of diagnosis.  Diet: Minimal snacking, infrequent consumption of fried foods, preference for water over other beverages, consumes coffee in the morning. Coffee/Tea/Caffeine-containing Drinks: Consumes coffee in the morning. Sleep: Reports changes in sleep habits, not sleeping as well as before, last sleep study conducted on 04/2023.   Current Medications[1] Allergies[2]    The following portions of the patient's history were reviewed and updated as appropriate: allergies, current medications, PFH, PMH, past social history, past surgical history and problem list.    Physical Exam  Vitals:   08/13/24 1426  BP: 114/74  BP Location: Left arm  Patient Position: Sitting  Pulse: 86  SpO2: 96%  Weight: 115 kg (253 lb 9.6 oz)  Height: 1.803 m (5' 11)  GENERAL APPEARANCE: 48 y.o., White or Caucasian,  Physical Exam   Patient is alert and oriented, well-spoken.  HEENT is grossly unremarkable except for some mild acne persistent. Neck is supple without adenopathy no palpable abnormality  appreciated today. Heart regular rate and rhythm Lungs are clear to auscultation Abdomen benign Extremities without significant edema pulses are full.  Labs reviewed in detail today.  Lab on 08/10/2024  Component Date Value Ref Range Status   C-reactive Protein 08/10/2024 7  0 - 10 mg/L Final   Sedimentation Rate 08/10/2024 14  0 - 30 mm/hr Final   GGT 08/10/2024 34  9 - 64 U/L Final   Magnesium 08/10/2024 2.0  1.9 - 2.7 mg/dL Final   Sodium 88/89/7974 137  136 - 145 mmol/L Final   Potassium 08/10/2024 4.5  3.5 - 5.1 mmol/L Final   NO VISIBLE HEMOLYSIS   Chloride 08/10/2024 105  98 - 107 mmol/L Final   CO2 08/10/2024 24  21 - 31 mmol/L Final   High lactate dehydrogenase (LDH) concentrations in patient samples may cause falsely increased bicarbonate results. If markedly elevated LDH levels are suspected, please assess results in conjunction with patient's LDH values.   Anion Gap 08/10/2024 8  6 - 14 mmol/L Final   Glucose, Random 08/10/2024 94  70 - 99 mg/dL Final   Blood Urea Nitrogen (BUN) 08/10/2024 13  7 - 25 mg/dL Final   Creatinine 88/89/7974 0.84  0.60 - 1.20 mg/dL Final   eGFR 88/89/7974 86  >59 mL/min/1.41m2 Final   GFR estimated by CKD-EPI equations(NKF 2021).   Recommend confirmation of Cr-based eGFR by using Cys-based eGFR and other filtration markers (if applicable) in complex cases and clinical decision-making, as needed.   Albumin 08/10/2024 3.7  3.5 - 5.7 g/dL Final   Total Protein 88/89/7974 6.6  6.4 - 8.9 g/dL Final   Bilirubin, Total 08/10/2024 0.4  0.3 - 1.0 mg/dL Final   Alkaline Phosphatase (ALP) 08/10/2024 65  34 - 104 U/L Final   Aspartate Aminotransferase (AST) 08/10/2024 18  13 - 39 U/L Final   Alanine Aminotransferase (ALT) 08/10/2024 19  7 - 52 U/L Final   Calcium 08/10/2024 9.2  8.6 - 10.3 mg/dL Final   BUN/Creatinine Ratio 08/10/2024    Final   Creatinine is normal, ratio is not clinically indicated.    Cholesterol, Total,  Lipid Panel 08/10/2024 200 (H)  <200 mg/dL Final                Optimal: <200 mg/dL Borderline High Risk:  200-239 mg/dL            High Risk: >760 mg/dL   Triglycerides, Lipid Panel 08/10/2024 172 (H)  <150 mg/dL Final                Optimal: <150 mg/dL Borderline High Risk:  150-199 mg/dL            High Risk: >800 mg/dL   HDL Cholesterol - Lipid Panel 08/10/2024 58  >50 mg/dL Final                Optimal: >60 mg/dL Borderline High Risk:  40-60 mg/dL            High Risk: <59 mg/dL   LDL Cholesterol, Calculated 08/10/2024 113 (H)  <100 mg/dL Final                Optimal: <100 mg/dL         Near Optimal:  100-129 Borderline High Risk:  130-159 mg/dL            High Risk:  160-189 mg/dL       Very High Risk: >810 mg/dL   Non-HDL Cholesterol 88/89/7974 142 (H)  <130 mg/dL Final                Optimal: <130 mg/dL Borderline High Risk:  130-160 mg/dL            High Risk: >839 mg/dL   TSH 88/89/7974 7.781  0.450 - 5.330 uIU/mL Final   Hemoglobin A1c 08/10/2024 5.6  <5.7 % Final   Normal A1c:             Less than 5.7%  Prediabetes range A1c:  5.7% to 6.4%  Diabetes range A1c:     Greater than 6.4%   Estimated Average Glucose 08/10/2024 114  mg/dL Final   The ADA has supported the calculation of Average Glucose (eAG) based on HbA1c measurements. However, the eAG reflects the average glycemic level over 2-3 months and it would not necessarily match single glucose laboratory measurements, nor reflect changes in the daily glucose concentration.   Iron 08/10/2024 112  50 - 212 ug/dL Final   Transferrin 88/89/7974 203  203 - 362 mg/dL Final   Ferritin 88/89/7974 71  11 - 307 ng/mL Final   Total Iron Binding Capacity (TIBC) 08/10/2024 290  290 - 518 ug/dL Final   Transferrin Saturation 08/10/2024 39  15 - 45 % Final   Vitamin D  25-Hydroxy 08/10/2024 33.8  30.0 - 100.0 ng/mL Final   Deficient: <20 ng/mL  Insufficient: 20-30 ng/mL  Sufficient: 30-100 ng/mL  Upper Safety  Limit/Toxicity: >100 ng/mL    Vitamin B-12 08/10/2024 457  180 - 914 pg/mL Final   Normal Range: 180-914 pg/mL Indeterminate Range: 145-180 pg/mL  Deficient Range: <145 pg/mL    Follicle Stimulating Hormone (FSH) 08/10/2024 2.8  mIU/mL Final   Female:  8 years - adult:   Mid-Follicular Phase: 3.9 - 8.8 mIU/mL  Mid-Cycle Peak:      4.5 - 22.5 mIU/mL  Mid-Luteal Phase:     1.8 - 5.1 mIU/mL  Postmenopausal:    16.7 - 113.6 mIU/mL    Luteinizing Hormone (LH) 08/10/2024 4.3  mIU/mL Final   Female: 8 years - adult:   Mid-Follicular Phase: 2.1-10.9 mIU/mL   Mid-Cycle Peak: 19.2-103.0 mIU/mL   Mid-Luteal Phase: 1.2-12.9 mIU/mL   Post menopausal:  10.9-58.6 mIU/mL   WBC 08/10/2024 7.00  4.40 - 11.00 10*3/uL Final   RBC 08/10/2024 4.30  4.10 - 5.10 10*6/uL Final   Hemoglobin 08/10/2024 13.9  12.3 - 15.3 g/dL Final   Hematocrit 88/89/7974 40.5  35.9 - 44.6 % Final   Mean Corpuscular Volume (MCV) 08/10/2024 94.0  80.0 - 96.0 fL Final   Mean Corpuscular Hemoglobin (MCH) 08/10/2024 32.3  27.5 - 33.2 pg Final   Mean Corpuscular Hemoglobin Conc (* 08/10/2024 34.3  33.0 - 37.0 g/dL Final   Red Cell Distribution Width (RDW) 08/10/2024 13.3  12.3 - 17.0 % Final   Platelet Count (PLT) 08/10/2024 210  150 - 450 10*3/uL Final   Mean Platelet Volume (MPV) 08/10/2024 9.5  6.8 - 10.2 fL Final   Neutrophils % 08/10/2024 60  % Final   Lymphocytes % 08/10/2024 24  % Final   Monocytes % 08/10/2024 6  % Final   Eosinophils % 08/10/2024 9  % Final   Basophils % 08/10/2024 1  % Final   nRBC % 08/10/2024 0  % Final  Neutrophils Absolute 08/10/2024 4.20  1.80 - 7.80 10*3/uL Final   Lymphocytes # 08/10/2024 1.70  1.00 - 4.80 10*3/uL Final   Monocytes # 08/10/2024 0.40  0.00 - 0.80 10*3/uL Final   Eosinophils # 08/10/2024 0.70 (H)  0.00 - 0.50 10*3/uL Final   Basophils # 08/10/2024 0.10  0.00 - 0.20 10*3/uL Final   nRBC Absolute 08/10/2024 0.00  <=0.00 10*3/uL Final        Diagnosis:  1. Thyroid nodule  US  Thyroid    2. SOB (shortness of breath)  Ambulatory referral to Pulmonology    3. Numbness and tingling in both hands      4. Major depressive disorder, recurrent severe without psychotic features    (CMD)          Plan:  Assessment & Plan 1. Fatigue: - Her fatigue may be attributed to chronic fatigue syndrome, potentially triggered by a COVID-19 infection three years ago. Weight gain could also be a contributing factor. - A repeat sleep study will be ordered to assess the severity of her sleep apnea. - She will continue her current regimen of duloxetine.  2. Weight gain: - Her weight gain could be influenced by hormonal changes associated with menopause. - The potential benefits of intermittent fasting were discussed. She is advised to explore different dietary strategies and make healthier food choices. - The potential use of Zepbound for weight management was discussed, but it is not covered by Medicaid unless certain conditions are met.  3. Hand numbness: - Her hand numbness could be due to carpal tunnel syndrome or a pinched nerve in the neck. - A referral to orthopedics will be made for further evaluation of her neck. If the orthopedic evaluation does not reveal any issues, nerve conduction studies will be considered to assess for carpal tunnel syndrome.  4. Thyroid nodules: - Her thyroid levels were within normal range during the last check.  Nodules noted as a finding on an ancillary scan for other symptoms. - A specific thyroid ultrasound will be ordered to evaluate the nodules. - If any suspicious findings are detected, a referral to ENT will be made for further evaluation.  5. Shortness of breath: - Her shortness of breath could be related to her weight gain and potential sleep apnea. - A referral to pulmonology will be made for further evaluation of her shortness of breath and to repeat the sleep study.  6.  Patient's  history of depression is well-controlled and stable.  Doing well.  Continue to monitor.  Follow-up: The patient will keep her cardiology appointment in February.  Results Labs  - Thyroid level: Normal  - Lipid Panel: Total cholesterol: 200, Triglycerides: below 300-400, HDL: good, LDL: 113  - A1c: 5.6  Imaging  - CT scan of thyroid: Thyroid nodules on both sides  Diagnostic Testing  - EKG: 06/2024, Sinus tachycardia, consider right atrial enlargement    No orders of the defined types were placed in this encounter.   Orders Placed This Encounter  Procedures   US  Thyroid   Ambulatory referral to Pulmonology      No follow-ups on file.    I discussed with patient warning signs and symptoms and seeking Emergency Room care as needed.   This document serves as a record of services personally performed by Achille CANDIE Crease, MD  Electronically signed by Achille Bethena Crease, MD 08/13/2024 2:46 PM          [1] Current Outpatient Medications  Medication Sig Dispense Refill  azelastine (ASTELIN) 137 mcg (0.1 %) nasal spray Administer 1 spray into each nostril 2 (two) times a day. Use in each nostril as directed (Patient taking differently: Administer 1 spray into each nostril 2 (two) times a day. Use in each nostril as directed PRN) 24.66 mL 3   busPIRone (BUSPAR) 5 mg tablet Take 1/2 to 1  tab 2 times daily 60 tablet 2   cetirizine (ZyrTEC) 10 mg tablet Take 10 mg by mouth daily.     cholecalciferol (VITAMIN D3) 5,000 unit (125 mcg) tab tablet Take 5,000 Units by mouth 3 (three) times a week.     cyanocobalamin, vitamin B-12, (VITAMIN B-12 ORAL) Take 1 tablet by mouth daily.     DULoxetine (CYMBALTA) 60 mg capsule Take 1 cap daily 30 capsule 2   famotidine  (PEPCID ) 20 mg tablet Take 20 mg by mouth as needed in the morning and 20 mg as needed in the evening for heartburn.     ferrous sulfate 325 mg (65 mg iron) tablet Take 325 mg by mouth. Pt is now taking  twice per week.     ivabradine (CORLANOR) 5 mg tab Take 1 tablet (5 mg total) by mouth 2 (two) times a day. 180 tablet 3   LORazepam  (Ativan ) 1 mg tablet Take 1&1/2 tabs twice daily as needed 90 tablet 2   magnesium 200 mg tab Take 600 mg by mouth daily.     multivitamin with folic acid 400 mcg tab Take 1 tablet by mouth daily.     omeprazole (PriLOSEC) 40 mg DR capsule TAKE 1 CAPSULE(40 MG) BY MOUTH DAILY (Patient taking differently: Take 40 mg by mouth as needed.) 90 capsule 1   SUMAtriptan (IMITREX) 100 mg tablet TAKE 1 TABLET BY MOUTH EVERY 2 HOURS AS NEEDED FOR MIGRAINE. MAX OF 2 TABLET A DAY. 9 tablet 5   No current facility-administered medications for this visit.  [2] Allergies Allergen Reactions   Benadryl  [Diphenhydramine  Hcl] Flushing   Clonazepam Fatigue    Altered mental status   Hydrocortisone     Rectal cream    Prednisone  Other (See Comments)    Unable to tolerate, caused flare    Topamax [Topiramate]     Hadaches    Trazodone Other (See Comments)    insomnia   Animal Derived Oil Other (See Comments)    Rhinosinusitis Symptoms   Lamictal [Lamotrigine] Rash    Rash on trunk   Neurontin [Gabapentin] Palpitations   Pollen Extracts Other (See Comments)    Rhinosinusitis Symptoms  "

## 2024-09-09 NOTE — Progress Notes (Signed)
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 Abilify she has not done well in the past we tried getting her off of it She has had trialed some other SGA's that she did not tolerate quite as well She is pleasant during today's assessment Ambien and lorazepam  as needed on rare occasion though it appears she had not picked up the Ambien since back in November according to controlled database and she does not require the lorazepam  very frequently Though she is taking the buspirone and the Cymbalta and Abilify consistently.  01/31/2023 Laura Simpson has been feeling overwhelmed stress going through divorce process having a tough time leaving the house without getting tearful That she had some health issues had been seen by neurology. Changes try and adjust the antidepressant had maxed out the Cymbalta, but she was still having a tough time she also has some cognitive issues at times she has had trials of several SGA's and we had increased the Abilify at the last visit But in the interim she cannot really tell that she is doing any better still having a hard time with depression Did she see neurology was reportedly having some jerking movements they were thinking her myoclonic jerks might be related to multiple serotonergic agents and had her stop her  trazodone Not observing jerking during the video visit today But I discussed with her that occasionally things like buspirone or Cymbalta could cause some muscle twitching or jerking or occasional mild hand tremors but usually this is very mild, so I do not know that it is related to her problem at all And am not observing tremors But once again we talked about possibly even just stopping the Abilify since it sounds like neurology was concerned it might be adding to the tremor issue  03/04/2023 Laura Simpson seen for follow-up med management At the last visit I had started her on lamotrigine, replacing her Abilify due to concerns she was having some possible jerking motions at times I was not really observing tremors but still due to concerns I had taken her off but she been seen by neurology for assessment Unfortunately she developed a rash on her trunk with the lamotrigine and ended up having to stop it after just about 3 days In the meantime though she just felt like some of her medicine is not working as well she went to the emergency room shortly after she had seen me while she was visiting [place] , had severe migraines some slurred speech,, and it was recommended she come back for MRI but did not follow-up with that But since that time she is still having a little bit of slurred speech, She been taking her lorazepam  a little more frequently due to her anxiety but she had felt like her medicines just had been working as well so she weaned herself down on Cymbalta so she is only taking 60 mg now She had weaned off of the BuSpar, she has been taking the lorazepam  a little more frequently Some nights she is taking 20 mg Ambien instead of just him because she is also had a harder time sleeping She is pleasant during the assessment She also had neuropsych testing with  Dr. Marykay back in May and it felt she did not have a major cognitive issue though he could not rule out temporary episodes in which her  cognition might worsen But felt there was a psychological component And then a course she is also experiencing conversion symptoms as well  She is pleasant during today's assessment Admits she still having a hard time with the depression some general anxiety and then more disrupted sleep  03/26/2023 Laura Simpson has been through a tough time the last few months, We had her come off of several medicines due to possible reactions including lamotrigine which she unfortunate developed a rash with Abilify was having some jerking motions and then she had weaned off her BuSpar and cut down on her Cymbalta feeling they just were not working so well She also has neuropsych testing back in May 2024 is felt that she did not have a major cognitive issue but that she might have some temporary episodes in which her cognition might be worse Feeling that was a psychological point component She is also dealt with a chronic disrupted sleep and anxiety At the last visit we had her come off the Cymbalta the rest of the way since she tried to cut down on it she has had a little bit of dizziness since cutting back and coming off of it is only been about 3 weeks usually just a side effect goes away fairly quickly I started her on nortriptyline at night in part for mood and also to help with some of her neurologic symptoms But and hoping it might help with sleep but it sounds like her sleep was more disrupted if anything so she moved it to morning time she is not feeling tired through the day She just increased it to 50 mg this past week So the dose is still on the low side She is having a harder time with sleep noting she just does not rest well and she takes 2 of the Ambien or either 2 of the lorazepam  at night and sometimes may take a third 1 if she still has trouble getting to sleep half hour to an hour later and then she seems to rest much better after that She is also been alternating between the 2's and so I think that  may impact her disrupted sleep to a degree as well Pleasant during the assessment though admits some continued anxiety and mild depression.  05/07/2023 Laura Simpson has been a tough time She is quite pleasant today She has had trials of several medicines which she did not tolerate due to various side effects And then had come off Cymbalta, She been on nortriptyline and initially felt like it was making a difference and so we had continued that at the last visit were hoping that might help with some of her neurologic symptoms also In the interim though she is contacted the office asking about coming off nortriptyline going back to Cymbalta She subsequently stopped the nortriptyline but held off on starting Cymbalta and overall she is felt less anxious she feels like she is turned the corner now she is taking the Ambien as needed for sleep not every night and then also lorazepam  as needed but not required it consistently and feels that these 2 in combination seem to be working fairly well for her. She would like to stay off the antidepressant a little longer to see how she continues going since she is felt better overall.  06/12/2023 Laura Simpson she had a trip  to the beach recently and had a terrible time there Had a panic attack for 1 thing and then took a lorazepam  and that seemed to help with the panic symptoms but that she felt her over the next day She continues having some odd symptoms some dissociative type symptoms at times some problems with focus concentration and word finding at times A lot of ruminative thinking, she seems to have heightened awareness of physical symptoms, Has had a hard time turning her mind off Today she brought in some notes with her because she is worried that she would not remember to tell me things She had come off Cymbalta she has been tried on several antidepressants and dopamine blockers as well she is cut back on the Ambien so she is taking it infrequently now she also is  rarely taking lorazepam  She had reconciled with her spouse,, but had been involved with someone else and had terminated that relationship She is starting counseling soon She is pleasant during the assessment, but it sounds like she has a lot of ruminative anxiety she is experiencing a lot of pseudo neurologic type symptoms, She has been followed by neurology and reports they feel like a lot of what she is experiencing may be more psychological than neurologic  07/16/2023 Laura Simpson has been to the hospital several times since she last saw me and sounds like she was treated on the medical floor followed by psychiatry She had a tough time since back during the summer when she went to the beach, going for several months she has had a number of pseudo neurologic type symptoms, She is recently started counseling She has had trials of a number of different agents, and she was concerned she might be having withdrawal from lorazepam  and had presented thinking about getting detox, she is only taking 1 mg most days, 90 neurologic symptoms underwent evaluation, 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 Her voice was a little tremulous she still appears somewhat anxious during the visit she is having a hard time with sleep She been taking the Ambien at night and she often breaks the lorazepam  tablets in the pieces taking part of it through the day She feels like it does not last very long and that has not been quite as effective as it had been previously She is somewhat anxious about taking medicine, and then she has had some reactions with many things she been tried on number of which I think are probably more anxiety related than actual side effects sometimes Reviewed collateral from the recent emergency room  visits and his last hospitalization  08/13/2023 Describing a number of neurologic type symptoms, tremulous walking, voice stuttering some Tried valium but ended up back on ativan  Taking 1mg  am and 1.25mg  pm Taking cymbalta 30mg   May take ambien if wakes up during night Neurology, also had seen cardiology recently, and the pattern of the different physical symptoms she is experienced is more suggestive of conversion type symptoms. She is pleasant during the assessment I was hoping the Valium might have more of a right muscle relaxing effect to help in particular with these weird tremor she is experience but once again she did not tolerate it very well ended up back on the Ativan   09/03/2023 Laura Simpson has been having a number of neurologic type symptoms some tremulousness difficulty with walking even voice stuttering and at the last visit I had increased her Cymbalta to 40 mg, I wanted to increase it further but was going slowly due to her anxiety about medicine in general She still probably under taking her lorazepam  But she admits she has been having continued tremors and they have been a little worse the last week in part she had hurt her back and then had some other symptoms had to be seen by family medicine so she not felt as well physically the last week or so either She is tolerated the increase in the Cymbalta dose to 40 mg, but cannot tell a huge difference since the dose was changed and she still finds her self getting tearful easily staying depressed in part dealing with these physical symptoms. pleasant during the visit related well, But her voice was tremulous  09/27/2023 Laura Simpson has dealt with a number of neurologic symptoms, tremulousness voice stuttering difficulty walking 1 point and more recently she has been diagnosed with dysautonomia she has been referred to a clinic for POTS but there is a long waiting list for that particular clinic She feels like her anxiety is doing some better  since we increase her Cymbalta to 60 mg though is hard to know exactly how much better she is not getting tearful is easily dealing with her physical morbidity and she appears calmer today overall she continues taking the lorazepam  as needed she is also taking the Ambien at night She is tolerated the Cymbalta 60 mg would like to leave it at this dose for another month She also like to explore gradually cutting back on lorazepam  again she had a hard time previously tried to come off of benzodiazepines and were talking very gradual titration even But she would like to maybe cut back by as little as an 1/8 mg at a time with the lorazepam  no she has a pill cutter so we talked about several options 1 would be even sending the medicine to a compounding pharmacy but she did not know if her Medicaid would cover something like that  10/25/23/  Laura Simpson is brighter overall today. She has experienced some conversion type symptoms and was having stuttering difficulty walking at 1 point was diagnosed with dysautonomia, We had increased her Cymbalta and it seems like it helped some she is not getting tearful is easily is a little less anxious dealing with the physical morbidity she continues taking lorazepam  and the Ambien is resting well She admits there are some continued anxiety and she is cut a half in half about whether she might want to try increasing the Cymbalta We also talked about the possibility of BuSpar but think it makes more sense to increase Cymbalta further She still experiencing the cognitive issues and some problems with her memory at times But she is feeling much more comfortable overall.  11/22/2023 Laura Simpson is tearful bit anxious today, She is in the hospital with a sinus infection, she also had a lumbar puncture due to concerns about ruling out possible meningitis since she had some altered mental status Worried about her cognitive functioning had had some dissociative episodes and I think a lot  of this is probably conversion related She is also worried whether she might be psychotic or delirious, and I discussed with her I did not think she had underlying thought disorder as per the delirium I doubt that it is delirium  currently quite lucid at the moment that does not mean she might not the having some confusion at other times but it does not sound like delirium Overall she admits that she had started to feel better so she did not go up on the Cymbalta as we discussed the last visit but she would like to try going up on it now She was recently switched to Omnicef she has not been taking that for 7 days anticipating possible release from the hospital tomorrow  12/11/2023 Pt up to 80mg  cymbalta, seems to be for the last few days has tolerated it so far she was want to stop the Cymbalta gradually see if she might tolerate it better so she can get to a higher dose Dealing some with the depression and anxiety as well as fatigue at times  Still dealing with the sinus infection, still on antibiotic A little bit of add sensation in head in afternoons, not sure Slight neurology gave her a low-dose of olanzapine for breakthrough migraines which she is to take as needed but she has not actually tried it she is stuck with her triptan, She asked about possibly going back to Abilify or whether it seemed to have energy boosting effect when she took it and I cannot recall and looking through the records, but she had been on several SGA's and 1 reason we moved away from Abilify was because of her tremors and not knowing if they may have been contributing to it Once again she still experiencing depression and anxiety but appears brighter overall today.  01/08/2024 Lashonne continues having some anxiety mood symptoms but she is also had some twitching at times that note that may be related to the Cymbalta increase, She tried getting up to a higher dose and did not tolerate it so we backed her down to 70 cc be  tolerating that quite well She is on a low-dose of olanzapine which could cause some movement issues though rare with olanzapine at the 5 mg dose but she is not actually taking it it was given by neurology for nausea with her migraines, She is still dealing with some anxiety she is back down on her lorazepam  she is now taking 1 mg in the morning and 1.25 mg in the afternoons Would like to try getting down further and maybe reintroducing BuSpar at some point   02/07/2024 Laura Simpson today She continues dealing with the chronic tremors she has had some twitching at times She tried increasing the Cymbalta just did not do well with the higher dose and so she subsequently backed it down to 60 mg now and seems to be tolerating that okay she was giving a low-dose of a olanzapine at 1 point but by neurology for nausea with her migraines but she never took it due to anxiety about taking it She has been able to back down some on her lorazepam  again and would like to work toward getting off of the weather. She has been sleeping fairly well with Ambien as needed she takes it infrequently, She would like to try working down further as she is reduced the lorazepam  by as little as 1/8 of a tablet, and would like to try working down further but she would also like to try starting back on buspirone as she could tolerate it since it seemed to help her anxiety do better she still has some 5 mg size tablets left at home.  03/16/2024 Laura Simpson is to be having a better day today,  but she admits last week was a tough week for her all week long she ended up going to the emergency room on June 12, with headache and dizziness feeling shaky, She had been trying to come off 1 medicine she been on previously and cut back on it very gradually, She had also tried increasing her BuSpar she had only been taking half of a 5 mg tablet and recently increased it to half twice a day, And then she been involved in a partial hospital or intensive  outpatient treatment program Once again she seems to be feeling better today, She is trying to figure out what may have account for her suddenly feeling worse again .  She did not know if perhaps some of it may have been the dysautonomia She is currently taking lorazepam  she is tried backing down on it but the lorazepam  has seemed to work well for her or at least partially, she is currently taking 1 mg during the day and then 1 mg and then 1/8 of a tablet at night And then she had back down on the BuSpar is currently taking half of the 5 mg tablet she would like to leave it there for now.  04/07/2024 Laura Simpson admits some incr anxiety, may have overdone last couple weeks, some incr in tremors, spouse also had to go to er with health issue, which stressed her as well Some increase in her hand tremors since has been more anxious and she can also get a little more down or tearful at times to and I think it is partly the stress in the anxiety you worrying about things She still only taking a very low-dose of the BuSpar taking 2-1/2 mg twice daily, And then had been cutting back on the lorazepam  but asked about possibly increasing her morning dose a little bit She was concerned whether going up on the dose might get it to work for a while and then it was stopped again potentially or she might develop tolerance She is pleasant during the assessment Her voice is tremulous  she relates well to the examiner.  05/11/2024 Pt notes Ive been doing good.  Sounding much brighter today, She feels like she is finally adjusted to some of the medicine changes But she has been much more active her husband has been out of work for a while, so he has been home to help out some and she is also being going some places in public with him but is felt more like doing more She feels her spiritual faith is part of what helped her but she also feels like she is adjusted to the medicine She continues taking Cymbalta 60 mg  daily Taking BuSpar 5 mg twice daily by breaking the tablets in half and then is taking the lorazepam  She has tried cutting back on its but feels fairly comfortable where she is at right now most days she takes 1 mg in the morning and then 1 mg and 1/4 tablets at night Her mood is good anxiety appears to be manageable.  She is certainly sounds calm her looks brighter overall during today's assessment  07/13/2024 Laura Simpson is in good spirits today, She has finally turned the corner in the last few months after struggling with depression and anxiety a lot of physical symptoms, worrying excessively about things getting tearful easily having tremors But this is diminished significantly She feels like she is back to her old self finally She still has the occasional down day but  not as severe as what she had experienced previously and that she knows she expect she will have a down day once in a while. But overall she has felt better she has been more active, she continues taking Cymbalta 60 mg daily taking BuSpar very low-dose, 2.5 mg twice daily, and then taking lorazepam  1 mg in the morning and usually 1-1/4 mg in the evenings She is not endorsing any adverse side effects Not endorsing any complaints today.  09/09/2024 Continues to do well since she last saw me she went through months but difficult time struggling with physical symptoms excessive worry tearfulness, but has felt better for the last few weeks here, not having any pervasive down days Mood and anxiety have been manageable, and she takes low dose of the lorazepam  has tolerated that quite well She feels like she is back to her old self She is not endorsing any adverse side effects from the medicine She is smiling appropriately during today's assessment. Medication in part for her improvement but also her religious faith    Prev paxil years ago celexa several mos, hard to tell if helpful adderall , not helpuful, more anxious Namenda did  not really seem to impact cognitive functioning Klonopin was helpful for anxiety, but had come off due to possible impact on cognition, had severe discontinuation symptoms even with a very slow weaning process Stimulant ineffective for memory\ Seroquel was too sedating Had offered Geodon but she ended up going back to Abilify  Ambien partially helpful but was still waking up in the middle of the night  Also.  She had a repeat neuropsych testing in September 19, 2021 She had had previous testing in 2021 And the results of the test did not show any major neurocognitive challenges. Been dealing with possible SVT and also been started on a beta-blocker at 1 point but notes that the cardiac workup ended up being negative and she was able to come off the beta-blocker and has done fine without it Takes the lorazepam  and the Ambien infrequently Zoloft possible reaction Hydroxyzine, felt bp went up  pmh Patient Active Problem List  Diagnosis   Major depressive disorder, recurrent, mild   Adjustment disorder with mixed anxiety and depressed mood   Generalized anxiety disorder   Primary insomnia   Inappropriate sinus tachycardia (CMD)   PSVT (paroxysmal supraventricular tachycardia) (CMD)   PVC's (premature ventricular contractions)   Major depressive disorder, recurrent severe without psychotic features    (CMD)   Cognitive complaints with normal neuropsychological exam   Asthma (CMD)   Benign familial tremor   Chest pain syndrome   Esophageal reflux   Head trauma   History of benign ovarian tumor   Hyperlipidemia   Cognitive disorder   GAD (generalized anxiety disorder)   Vestibular migraine   Sedative, 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)   Impaired functional mobility, balance, gait, and endurance   Chronic sinusitis   Hypotension   Irritable colon   Numbness and tingling in both hands   Thyroid nodule   Conversion disorder    Dysautonomia    (CMD)   Dehydration   Major depressive disorder/anxiety   Requires daily assistance for activities of daily living (ADL) and comfort needs   Pulsatile tinnitus of left ear    Pers/soc No cd issues Reconciled with spouse, now going thru divorce  Psych etoh in paternal gf  Review of system Neuro positive for headache, negative for seizure Psych, pos for anxiety, negfor depression,  neg for insomnia    Mental status exam MSE Appearance  appears stated age, well nourished, Well developed  and clean  Behavior  calm and cooperative Motor not observing the tremors today Speech normal volume, normal rate and clear Mood euthymic  Affect  appropriate Thought process  coherent Thought content no SI, no hi, no delusions. 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 Marykay in May 2024 did not feel like she had a major cognitive issue but felt like there was a strong psychological component  Mood and anxiety have been manageable sleeping well, Appears to be functioning at baseline levels feeling much better overall, will continue with current regimen    continue with the BuSpar 5 mg a half or 1 tablet up to twice daily she has been taking just a half tab twice daily  Continue taking lorazepam  1 mg, prescription will say that she could take 1.5 mg twice daily, but she has been taking 1 mg in the morning and 1-1/4 mg at night   continue with Cymbalta at 60 mg daily she did not tolerate the higher dose and she is gradually backed down on this   Reviewed potential side effects how to reach as needed  Return in 2  months   She is now seeing a counselor   I reviewed the [place]  controlled database 09/09/2024    Orders Placed This Encounter  Medications   DULoxetine (CYMBALTA) 60 mg capsule    Sig: Take 1 cap daily    Dispense:  30 capsule    Refill:  2   busPIRone (BUSPAR) 5 mg tablet    Sig: Take 1/2 to 1  tab 2 times daily    Dispense:  60 tablet    Refill:  2   LORazepam  (Ativan ) 1 mg tablet    Sig: Take 1&1/2 tabs twice daily as needed    Dispense:  90 tablet    Refill:  2

## 2024-09-27 ENCOUNTER — Other Ambulatory Visit: Payer: Self-pay

## 2024-09-27 ENCOUNTER — Emergency Department (HOSPITAL_BASED_OUTPATIENT_CLINIC_OR_DEPARTMENT_OTHER)
Admission: EM | Admit: 2024-09-27 | Discharge: 2024-09-27 | Disposition: A | Discharge status: Home / Self Care | Attending: Emergency Medicine | Admitting: Emergency Medicine

## 2024-09-27 ENCOUNTER — Encounter (HOSPITAL_BASED_OUTPATIENT_CLINIC_OR_DEPARTMENT_OTHER): Payer: Self-pay

## 2024-09-27 ENCOUNTER — Emergency Department (HOSPITAL_BASED_OUTPATIENT_CLINIC_OR_DEPARTMENT_OTHER)

## 2024-09-27 DIAGNOSIS — R059 Cough, unspecified: Secondary | ICD-10-CM | POA: Diagnosis present

## 2024-09-27 DIAGNOSIS — B349 Viral infection, unspecified: Secondary | ICD-10-CM | POA: Insufficient documentation

## 2024-09-27 LAB — CBC
HCT: 43 % (ref 36.0–46.0)
Hemoglobin: 14.7 g/dL (ref 12.0–15.0)
MCH: 32.5 pg (ref 26.0–34.0)
MCHC: 34.2 g/dL (ref 30.0–36.0)
MCV: 95.1 fL (ref 80.0–100.0)
Platelets: 243 K/uL (ref 150–400)
RBC: 4.52 MIL/uL (ref 3.87–5.11)
RDW: 12.8 % (ref 11.5–15.5)
WBC: 8.2 K/uL (ref 4.0–10.5)
nRBC: 0 % (ref 0.0–0.2)

## 2024-09-27 LAB — RESP PANEL BY RT-PCR (RSV, FLU A&B, COVID)  RVPGX2
Influenza A by PCR: NEGATIVE
Influenza B by PCR: NEGATIVE
Resp Syncytial Virus by PCR: NEGATIVE
SARS Coronavirus 2 by RT PCR: NEGATIVE

## 2024-09-27 LAB — BASIC METABOLIC PANEL WITH GFR
Anion gap: 10 (ref 5–15)
BUN: 9 mg/dL (ref 6–20)
CO2: 24 mmol/L (ref 22–32)
Calcium: 9 mg/dL (ref 8.9–10.3)
Chloride: 101 mmol/L (ref 98–111)
Creatinine, Ser: 0.85 mg/dL (ref 0.44–1.00)
GFR, Estimated: >60 mL/min
Glucose, Bld: 125 mg/dL — ABNORMAL HIGH (ref 70–99)
Potassium: 4.1 mmol/L (ref 3.5–5.1)
Sodium: 135 mmol/L (ref 135–145)

## 2024-09-27 LAB — TROPONIN T, HIGH SENSITIVITY: Troponin T High Sensitivity: <15 ng/L (ref 0–19)

## 2024-09-27 MED ORDER — METHYLPREDNISOLONE 4 MG PO TBPK: ORAL_TABLET | ORAL | 0 refills | Status: AC

## 2024-09-27 NOTE — ED Triage Notes (Signed)
 Reports mid chest pain radiates to mid back for 1 week. States 2 days it worsened.   Also reports cough, congestion, body aches, fatigue for 1 week.

## 2024-09-27 NOTE — ED Provider Notes (Signed)
 " [facility] Provider Note   CSN: [id number] Arrival date & time: 09/27/24  9091     Patient presents with: Chest Pain   Laura Simpson is a 48 y.o. female presenting to the ED with 7 days of constellation of symptoms.  Patient reports that she has had cough, congestion, sinus pressure, body aches, fatigue, nausea, vomiting.  She said she took 3 days of Augmentin  she had leftover with no improvement.  She went to an urgent care when she was diagnosed with possible sinus infection and prescribed Flonase and Mucinex, but feels the Flonase is worsening her anxiety.  Her husband whom she lives with has very identical symptoms, both of them have been sick.   HPI     Prior to Admission medications  Medication Sig Start Date End Date Taking? Authorizing Provider  methylPREDNISolone  (MEDROL  DOSEPAK) 4 MG TBPK tablet Use as directed on package 09/27/24  Yes Tinie Mcgloin, Donnice PARAS, MD  acetaZOLAMIDE (DIAMOX) 250 MG tablet Take 1 tablet by mouth 2 (two) times daily. 02/20/24   [provider]  albuterol  (VENTOLIN  HFA) 108 (90 Base) MCG/ACT inhaler Inhale 2 puffs into the lungs every 4 (four) hours as needed for wheezing. 09/27/15   [provider]  amoxicillin -clavulanate (AUGMENTIN ) 875-125 MG tablet Take 1 tablet by mouth every 12 (twelve) hours. 11/17/23   Jerral Meth, MD  busPIRone (BUSPAR) 5 MG tablet Take 5 mg by mouth 2 (two) times daily. Takes 2.5mg  BID will taper soon 02/07/24   [provider]  cetirizine (ZYRTEC) 10 MG tablet Take 10 mg by mouth daily.    [provider]  DULoxetine (CYMBALTA) 60 MG capsule Take 60 mg by mouth daily. 06/11/22   [provider]  famotidine  (PEPCID ) 20 MG tablet Take 20 mg by mouth daily. Taking prn    [provider]  ferrous sulfate 325 (65 FE) MG tablet Take 325 mg by mouth daily with breakfast.    [provider]  ivabradine (CORLANOR) 5 MG TABS  tablet Take 2.5 mg by mouth 2 (two) times daily with a meal. 12/13/23 12/12/24  [provider]  LORazepam  (ATIVAN ) 1 MG tablet Take 1 mg by mouth 2 (two) times daily. Takes 1mg  am and 1.5mg  in the evening 09/05/22   [provider]  MAGNESIUM PO Take 400 mg by mouth daily.    [provider]  metroNIDAZOLE  (FLAGYL ) 500 MG tablet Take 1 tablet (500 mg total) by mouth 2 (two) times daily. 06/25/24   Neysa Caron PARAS, DO  Multiple Vitamin (QUINTABS) TABS Take 1 tablet by mouth daily.    [provider]  omeprazole (PRILOSEC) 40 MG capsule Take 40 mg by mouth daily as needed (as needed). 10/12/22   [provider]  ondansetron  (ZOFRAN -ODT) 4 MG disintegrating tablet Take 4 mg by mouth every 8 (eight) hours as needed. 12/31/23   [provider]  SUMAtriptan (IMITREX) 50 MG tablet Take 100 mg by mouth every 2 (two) hours as needed for migraine (as needed for migraines). 09/17/14   [provider]  zolpidem (AMBIEN) 10 MG tablet Take 10 mg by mouth at bedtime as needed for sleep. 06/11/22   [provider]    Allergies: Benadryl  [diphenhydramine ], Diphenhydramine  hcl, Hydrocortisone, Klonopin [clonazepam], Topiramate, Trazodone, Trazodone and nefazodone, Gabapentin, Lamotrigine, and Pollen extract    Review of Systems  Updated Vital Signs BP 105/67   Pulse 79   Temp 99.3 F (37.4 C)  Resp 18   SpO2 96%   Physical Exam Constitutional:      General: She is not in acute distress. HENT:     Head: Normocephalic and atraumatic.  Eyes:     Conjunctiva/sclera: Conjunctivae normal.     Pupils: Pupils are equal, round, and reactive to light.  Cardiovascular:     Rate and Rhythm: Regular rhythm. Tachycardia present.  Pulmonary:     Effort: Pulmonary effort is normal. No respiratory distress.  Abdominal:     General: There is no distension.     Tenderness: There is no abdominal tenderness.  Skin:    General: Skin is warm and dry.   Neurological:     General: No focal deficit present.     Mental Status: She is alert. Mental status is at baseline.  Psychiatric:        Mood and Affect: Mood normal.        Behavior: Behavior normal.     (all labs ordered are listed, but only abnormal results are displayed) Labs Reviewed  BASIC METABOLIC PANEL WITH GFR - Abnormal; Notable for the following components:      Result Value   Glucose, Bld 125 (*)    All other components within normal limits  RESP PANEL BY RT-PCR (RSV, FLU A&B, COVID)  [id number]  CBC  TROPONIN T, HIGH SENSITIVITY    EKG: EKG Interpretation Date/Time:  Sunday September 27 2024 09:22:32 EST Ventricular Rate:  112 PR Interval:  131 QRS Duration:  84 QT Interval:  320 QTC Calculation: 437 R Axis:   63  Text Interpretation: Sinus tachycardia Borderline T abnormalities, inferior leads Confirmed by Cottie Cough [phone]) on 09/27/2024 9:36:24 AM  Radiology: [facility] Chest 2 View Result Date: 09/27/2024 CLINICAL DATA:  Worsening mid chest pain with cough, congestion and body aches. EXAM: DG CHEST 2V COMPARISON:  June 25, 2024 FINDINGS: The heart size and mediastinal contours are within normal limits. Low lung volumes are noted. No acute infiltrate, pleural effusion or pneumothorax is identified. The visualized skeletal structures are unremarkable. IMPRESSION: No active cardiopulmonary disease. Electronically Signed   By: Suzen Dials M.D.   On: 09/27/2024 11:25     Procedures   Medications Ordered in the ED - No data to display                                  Medical Decision Making Amount and/or Complexity of Data Reviewed Labs: ordered. Radiology: ordered.  Risk Prescription drug management.   Patient presented to ED with constellation of symptoms noted above.  This is strongly suggestive of a viral syndrome.  Her flu, COVID and RSV test were negative.  However, I reviewed the rest of her labs and her white blood cell count is also  normal.  Troponin is undetectable.  She does describe some pressure across the front of her chest, worse after coughing, and I strongly suspect this is related more so to bronchitis, doubt ACS, doubt acute PE.  Her heart rate is in the 90s in the room.  They live in a trailer home.  Discussed the possibility of carbon monoxide exposure, and she will be checking with the detector at home.  However, the weather has been warmer this week, and have not been running that heat consistently, or had problems like this earlier in the winter, so I think this is less likely.  We do not have immediate access to  carboxyhemoglobin testing here in the ED.  Alternatively, explained this could still be the tail end of a viral syndrome.  I recommend she discontinue the Flonase which seems to be exacerbating her anxiety and tachycardia.  She has many adverse reactions to medications, but reports she does tolerate smaller and shorter doses of prednisone , and feels that this might help her.  I agree that this may benefit her overall, with both appetite stimulation and generalized body pains.  She is not having any evidence of asthma exacerbation or wheezing on exam.  I reviewed her x-ray which shows no focal infiltrate  Her EKG per my interpretation does not show any acute ischemic findings  She can follow-up with her PCP.  No indication for antibiotics at this time     Final diagnoses:  Viral syndrome    ED Discharge Orders          Ordered    methylPREDNISolone  (MEDROL  DOSEPAK) 4 MG TBPK tablet       Note to Pharmacy: Patient confirms that she has tolerated oral prednisone  and steroids with no allergy reaction   09/27/24 1026               Cottie Donnice PARAS, MD 09/27/24 1457  "

## 2024-09-28 NOTE — Progress Notes (Addendum)
 "  [facility] [place] [address] [place] [address] [phone]  Laura Simpson 1976/09/12 09/28/2024   Chief Complaint  Patient presents with   URI    Started Sunday 12/21 with head congestion. Progressively got worse since. Denies any fever. Having chest congestion. Using mucinex, flonase, and nasal spray. Covid, flu swabs neg. CXR completed yesterday. Took augmentin  for 2 days.     HPI:  History of Present Illness The patient presents for evaluation of a viral illness.  She reports experiencing feverish symptoms, including chills and a sensation of intense heat. Her condition has been progressively worsening since 09/24/2024, with the onset of a wet cough that has led to urinary incontinence and vomiting. She sought medical attention at an urgent care facility on 09/25/2024 due to sinus discomfort, where she was tested for influenza, COVID-19, and RSV, all of which returned negative results. Overnight, her symptoms have worsened, and she developed a severe wet cough. She is currently not on any antibiotics. She expresses concern about potential exacerbation of her anxiety due to her current illness. She also reports feeling disoriented and dizzy.   She started experiencing yellow drainage on 09/23/2024 and began self-medicating with Augmentin  for two days, which she had previously used without adverse effects for bacterial vaginosis. However, she was advised to discontinue its use at the urgent care visit. She was prescribed Astelin and plain Mucinex, but had to stop using Flonase due to exacerbation of her anxiety. She attempted nasal irrigation and increased fluid intake, but these measures did not alleviate her symptoms. She was prescribed a 4 mg methylprednisolone  pack yesterday, but expresses apprehension about its use due to previous experiences with prednisone  withdrawal.  She has been using Vicks vapor rub after bathing. She  is considering adjusting her duloxetine dosage to nighttime to see if it improves her symptoms. She is unable to take phenylephrine and has found Tessalon and codeine ineffective for her cough.  She is scheduled to see a cardiologist in 11/2024.   Current Medications[1] Allergies[2]    The following portions of the patient's history were reviewed and updated as appropriate: allergies, current medications, PFH, PMH, past social history, past surgical history and problem list.    Physical Exam  Vitals:   09/28/24 1136  BP: 138/88  BP Location: Left arm  Patient Position: Sitting  Pulse: (!) 124  SpO2: 97%  Weight: 112 kg (247 lb 6.4 oz)  Height: 1.803 m (5' 11)    GENERAL APPEARANCE: 48 y.o., White or Caucasian,  Physical Exam Constitutional:      General: She is not in acute distress.    Appearance: She is ill-appearing.  HENT:     Head: Normocephalic and atraumatic.     Ears:     Comments: Small serous fluid bilateral TMs    Nose:     Comments: There is slightly boggy no profuse drainage or rhinorrhea. Sinuses without significant tenderness    Mouth/Throat:     Mouth: Mucous membranes are moist.     Pharynx: No oropharyngeal exudate.     Comments: Subtle erythema and bogginess.  No exudate or severe erythema Eyes:     Pupils: Pupils are equal, round, and reactive to light.     Comments: Mildly glassy injected conjunctiva  Cardiovascular:     Rate and Rhythm: Normal rate and regular rhythm.     Pulses: Normal pulses.     Heart sounds: Normal heart sounds.  Pulmonary:  Effort: Pulmonary effort is normal.     Breath sounds: Normal breath sounds.  Musculoskeletal:     Cervical back: Normal range of motion and neck supple. No rigidity or tenderness.  Lymphadenopathy:     Cervical: No cervical adenopathy.  Skin:    General: Skin is warm and dry.  Neurological:     General: No focal deficit present.     Mental Status: She is alert.        Office Visit on  09/25/2024  Component Date Value Ref Range Status   Flu A 09/25/2024 Negative  Negative Final   Flu B 09/25/2024 Negative  Negative Final   Internal Control 09/25/2024 Acceptable   Final   Kit/Device Lot # 09/25/2024 [id number]   Final   Kit/Device Expiration Date 09/25/2024 06/30/26   Final   COVID Antigen, POC 09/25/2024 Negative  Negative Final   Internal Control 09/25/2024 Acceptable   Final   Kit/Device Lot # 09/25/2024 [id number]   Final   Kit/Device Expiration Date 09/25/2024 04/14/25   Final       Diagnosis:  1. Upper respiratory tract infection, unspecified type  methylPREDNISolone  sodium succinate (PF) (SOLU-Medrol ) injection 125 mg    2. Influenza-like illness      3. Acute cough      4. Bronchitis      5. Generalized anxiety disorder          Plan:  Assessment & Plan 1. Viral illness/ ILI vs sinobronchitis: - Symptoms suggest a possible influenza-like illness, although tests for influenza A, COVID-19, and RSV were negative. The cough is likely viral in nature and may persist for several weeks, with the most severe phase typically resolving within 6 days. - A chest x-ray revealed low lung volumes, likely due to the current illness. An EKG at urgent care indicated sinus tachycardia and borderline T abnormalities in the inferior leads, stable.  Patient has cardiology appointment next week.  No chest pain at this time. - A prescription for doxycycline  and promethazine DM has been provided. She is advised to continue using over-the-counter cold medications such as Tylenol  or Advil  as needed. - Saline spray and Vicks VapoRub are recommended for symptom relief. If there is no improvement within a week, she should inform the office. - Solu-Medrol  IM today for inflammation cough and congestion.  Hopefully this will give her a slow natural taper without her having to taper off oral pills.  However that still an option is indicated.  Follow-up next week for recheck unless  significantly improved.  Results Labs  - Influenza A test: Negative  - COVID-19 test: Negative  - RSV test: Negative  Imaging  - Chest x-ray: 09/27/2024, No active cardiopulmonary disease, heart size and mediastinal contour within normal limits, low lung volumes noted, no acute infiltrate, pleural effusion, or pneumothorax identified. Visualized skeletal structures are unremarkable.  Diagnostic Testing  - EKG: Sinus tachycardia, borderline T abnormalities in inferior leads    Orders Placed This Encounter  Medications   DISCONTD: azithromycin (ZITHROMAX) 250 mg tablet    Sig: Take 2 tablets (500 mg total) by mouth daily for 1 day, THEN 1 tablet (250 mg total) daily for 4 days.    Dispense:  6 tablet    Refill:  0   promethazine-dextromethorphan (PHENERGAN DM) 6.25-15 mg/5 mL syrp syrup    Sig: Take 5 mL by mouth every 4 (four) hours.    Dispense:  118 mL    Refill:  0   doxycycline  (MONODOX ) 100  mg capsule    Sig: Take 1 capsule (100 mg total) by mouth 2 (two) times a day for 10 days. Take with a full glass of water. Do not lie down for at least 30 min    Dispense:  20 capsule    Refill:  0   methylPREDNISolone  sodium succinate (PF) (SOLU-Medrol ) injection 125 mg    No orders of the defined types were placed in this encounter.     No follow-ups on file.    I discussed with patient warning signs and symptoms and seeking Emergency Room care as needed.   This document serves as a record of services personally performed by Achille CANDIE Crease, MD  Electronically signed by Achille Bethena Crease, MD 09/28/2024 11:56 AM          [1] Current Outpatient Medications  Medication Sig Dispense Refill   busPIRone (BUSPAR) 5 mg tablet Take 1/2 to 1  tab 2 times daily 60 tablet 2   cetirizine (ZyrTEC) 10 mg tablet Take 10 mg by mouth daily.     cholecalciferol (VITAMIN D3) 5,000 unit (125 mcg) tab tablet Take 5,000 Units by mouth 3 (three) times a week.      cyanocobalamin, vitamin B-12, (VITAMIN B-12 ORAL) Take 1 tablet by mouth daily.     DULoxetine (CYMBALTA) 60 mg capsule Take 1 cap daily 30 capsule 2   famotidine  (PEPCID ) 20 mg tablet Take 20 mg by mouth as needed in the morning and 20 mg as needed in the evening for heartburn.     ferrous sulfate 325 mg (65 mg iron) tablet Take 325 mg by mouth. Pt is now taking twice per week.     ivabradine (CORLANOR) 5 mg tab Take 1 tablet (5 mg total) by mouth 2 (two) times a day. 180 tablet 3   LORazepam  (Ativan ) 1 mg tablet Take 1&1/2 tabs twice daily as needed 90 tablet 2   magnesium 200 mg tab Take 600 mg by mouth daily.     multivitamin with folic acid 400 mcg tab Take 1 tablet by mouth daily.     omeprazole (PriLOSEC) 40 mg DR capsule TAKE 1 CAPSULE(40 MG) BY MOUTH DAILY (Patient taking differently: Take 40 mg by mouth as needed.) 90 capsule 1   SUMAtriptan (IMITREX) 100 mg tablet TAKE 1 TABLET BY MOUTH EVERY 2 HOURS AS NEEDED FOR MIGRAINE. MAX OF 2 TABLET A DAY. 9 tablet 5   amoxicillin -pot clavulanate (AUGMENTIN ) 875-125 mg per tablet Take 1 tablet by mouth in the morning and 1 tablet in the evening. (Patient not taking: Reported on 09/28/2024)     azelastine (ASTELIN) 137 mcg (0.1 %) nasal spray Administer 1 spray into each nostril 2 (two) times a day. Use in each nostril as directed (Patient not taking: Reported on 09/28/2024) 24.66 mL 3   doxycycline  (MONODOX ) 100 mg capsule Take 1 capsule (100 mg total) by mouth 2 (two) times a day for 10 days. Take with a full glass of water. Do not lie down for at least 30 min 20 capsule 0   fluticasone propionate (FLONASE) 50 mcg/spray nasal spray Administer 1 spray into each nostril 2 (two) times a day. (Patient not taking: Reported on 09/28/2024) 9 g 0   oseltamivir  (TAMIFLU ) 75 mg capsule Take 1 capsule (75 mg total) by mouth 2 (two) times a day for 5 days. (Patient not taking: Reported on 09/28/2024) 10 capsule 0   promethazine-dextromethorphan  (PHENERGAN DM) 6.25-15 mg/5 mL syrp syrup Take 5 mL by  mouth every 4 (four) hours. 118 mL 0   No current facility-administered medications for this visit.  [2] Allergies Allergen Reactions   Benadryl  [Diphenhydramine  Hcl] Flushing   Clonazepam Fatigue    Altered mental status   Hydrocortisone     Rectal cream    Prednisone  Other (See Comments)    Unable to tolerate, caused flare    Topamax [Topiramate]     Hadaches    Trazodone Other (See Comments)    insomnia   Animal Derived Oil Other (See Comments)    Rhinosinusitis Symptoms   Lamictal [Lamotrigine] Rash    Rash on trunk   Neurontin [Gabapentin] Palpitations   Pollen Extracts Other (See Comments)    Rhinosinusitis Symptoms  "

## 2024-09-30 ENCOUNTER — Emergency Department (HOSPITAL_BASED_OUTPATIENT_CLINIC_OR_DEPARTMENT_OTHER)
Admission: EM | Admit: 2024-09-30 | Discharge: 2024-09-30 | Disposition: A | Discharge status: Home / Self Care | Source: Ambulatory Visit | Attending: Emergency Medicine | Admitting: Emergency Medicine

## 2024-09-30 ENCOUNTER — Other Ambulatory Visit: Payer: Self-pay

## 2024-09-30 ENCOUNTER — Emergency Department (HOSPITAL_BASED_OUTPATIENT_CLINIC_OR_DEPARTMENT_OTHER)

## 2024-09-30 ENCOUNTER — Encounter (HOSPITAL_BASED_OUTPATIENT_CLINIC_OR_DEPARTMENT_OTHER): Payer: Self-pay

## 2024-09-30 DIAGNOSIS — B349 Viral infection, unspecified: Secondary | ICD-10-CM | POA: Insufficient documentation

## 2024-09-30 DIAGNOSIS — R0981 Nasal congestion: Secondary | ICD-10-CM | POA: Diagnosis present

## 2024-09-30 LAB — CBC WITH DIFFERENTIAL/PLATELET
Abs Immature Granulocytes: 0.03 K/uL (ref 0.00–0.07)
Basophils Absolute: 0.1 K/uL (ref 0.0–0.1)
Basophils Relative: 2 %
Eosinophils Absolute: 1 K/uL — ABNORMAL HIGH (ref 0.0–0.5)
Eosinophils Relative: 13 %
HCT: 42.8 % (ref 36.0–46.0)
Hemoglobin: 14.6 g/dL (ref 12.0–15.0)
Immature Granulocytes: 0 %
Lymphocytes Relative: 21 %
Lymphs Abs: 1.6 K/uL (ref 0.7–4.0)
MCH: 32.2 pg (ref 26.0–34.0)
MCHC: 34.1 g/dL (ref 30.0–36.0)
MCV: 94.5 fL (ref 80.0–100.0)
Monocytes Absolute: 0.8 K/uL (ref 0.1–1.0)
Monocytes Relative: 10 %
Neutro Abs: 4.1 K/uL (ref 1.7–7.7)
Neutrophils Relative %: 54 %
Platelets: 270 K/uL (ref 150–400)
RBC: 4.53 MIL/uL (ref 3.87–5.11)
RDW: 13.2 % (ref 11.5–15.5)
WBC: 7.7 K/uL (ref 4.0–10.5)
nRBC: 0 % (ref 0.0–0.2)

## 2024-09-30 LAB — URINALYSIS, W/ REFLEX TO CULTURE (INFECTION SUSPECTED)
Bilirubin Urine: NEGATIVE
Glucose, UA: NEGATIVE mg/dL
Hgb urine dipstick: NEGATIVE
Ketones, ur: NEGATIVE mg/dL
Leukocytes,Ua: NEGATIVE
Nitrite: NEGATIVE
Protein, ur: NEGATIVE mg/dL
Specific Gravity, Urine: 1.01 (ref 1.005–1.030)
pH: 6 (ref 5.0–8.0)

## 2024-09-30 LAB — LACTIC ACID, PLASMA: Lactic Acid, Venous: 2 mmol/L (ref 0.5–1.9)

## 2024-09-30 LAB — COMPREHENSIVE METABOLIC PANEL WITH GFR
ALT: 20 U/L (ref 0–44)
AST: 20 U/L (ref 15–41)
Albumin: 4.1 g/dL (ref 3.5–5.0)
Alkaline Phosphatase: 80 U/L (ref 38–126)
Anion gap: 14 (ref 5–15)
BUN: 14 mg/dL (ref 6–20)
CO2: 20 mmol/L — ABNORMAL LOW (ref 22–32)
Calcium: 9.2 mg/dL (ref 8.9–10.3)
Chloride: 103 mmol/L (ref 98–111)
Creatinine, Ser: 1.06 mg/dL — ABNORMAL HIGH (ref 0.44–1.00)
GFR, Estimated: >60 mL/min
Glucose, Bld: 101 mg/dL — ABNORMAL HIGH (ref 70–99)
Potassium: 4.1 mmol/L (ref 3.5–5.1)
Sodium: 137 mmol/L (ref 135–145)
Total Bilirubin: 0.2 mg/dL (ref 0.0–1.2)
Total Protein: 7.3 g/dL (ref 6.5–8.1)

## 2024-09-30 MED ORDER — ACETAMINOPHEN 500 MG PO TABS
1000.0000 mg | ORAL_TABLET | Freq: Once | ORAL | Status: AC
  Administered 2024-09-30: 1000 mg via ORAL
  Filled 2024-09-30: qty 2

## 2024-09-30 MED ORDER — SODIUM CHLORIDE 0.9 % IV BOLUS
1000.0000 mL | Freq: Once | INTRAVENOUS | Status: AC
  Administered 2024-09-30: 1000 mL via INTRAVENOUS

## 2024-09-30 NOTE — ED Triage Notes (Signed)
 Pt arrives to ED with complaints of body aches, Upper resp congestion. Pt has a headache. Was seen on Sunday for similar symptoms. Went to PCP and was prescribed solumedrol and doxyclycine. Pt is concerned for serotonin syndrome. Pt also states that she has been sick for 11 days and feels worse than ever.

## 2024-09-30 NOTE — Discharge Instructions (Signed)
 While you were in the emergency room, you had blood work done that overall was normal.  You are mildly dehydrated, so we gave you a liter of IV fluids.  Like we discussed, I think you likely have the flu.  Unfortunately there are many viral illnesses going around and oftentimes they can stack up back-to-back.  Continue to take Motrin  and Tylenol  at home.  Make sure that you are getting plenty of fluids to drink.  Your symptoms should begin to improve over the next 4 to 5 days.  Follow-up with your primary care doctor.

## 2024-09-30 NOTE — ED Provider Notes (Signed)
 "  EMERGENCY DEPARTMENT AT [facility] Provider Note  CSN: [id number] Arrival date & time: 09/30/24 1410  Chief Complaint(s) multiple complaints  HPI Laura Simpson is a 48 y.o. female who is here today for feeling unwell.  Patient reports that she has been having bodyaches, headache, nasal congestion.  Patient states that she has been sick for about the last 11 days.  She reports that she started to feel a bit better, however on 1229 she started to feel worse.  She went to an urgent care, was prescribed steroids and doxycycline .  She comes in today because she continues to feel unwell.  Patient was concerned that she may be exhibiting symptoms of serotonin syndrome.   Past Medical History Past Medical History:  Diagnosis Date   Anxiety    Depression    Dysautonomia (HCC)    Hypoglycemia    Migraines    POTS (postural orthostatic tachycardia syndrome)    Tachycardia    TBI (traumatic brain injury) [facility])    Patient Active Problem List   Diagnosis Date Noted   MDD (major depressive disorder), recurrent episode, moderate (HCC) 02/18/2024   MDD (major depressive disorder), recurrent episode, severe (HCC) 08/26/2023   Generalized anxiety disorder 08/26/2023   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   Hypotension 01/08/2017   No energy 01/08/2017   Home Medication(s) Prior to Admission medications  Medication Sig Start Date End Date Taking? Authorizing Provider  busPIRone (BUSPAR) 5 MG tablet Take 5 mg by mouth 2 (two) times daily. Takes 2.5mg  BID will taper soon 02/07/24  Yes [provider]  cetirizine (ZYRTEC) 10 MG tablet Take 10 mg by mouth daily.   Yes [provider]  doxycycline  (MONODOX ) 100 MG capsule Take 100 mg by mouth 2 (two) times daily. Take 1 capsule (100 mg total) by mouth 2 (two) times a day for 10 days. Take with a full glass of water. Do not lie down for at least 30 min 09/28/24 10/08/24 Yes [provider]  fluticasone  (FLONASE) 50 MCG/ACT nasal spray Place 1 spray into the nose 2 (two) times daily. 09/25/24 10/25/24 Yes [provider]  ivabradine (CORLANOR) 5 MG TABS tablet Take 2.5 mg by mouth 2 (two) times daily with a meal. 12/13/23 12/12/24 Yes [provider]  LORazepam  (ATIVAN ) 1 MG tablet Take 1 mg by mouth 2 (two) times daily. Takes 1mg  am and 1.5mg  in the evening 09/05/22  Yes [provider]  methylPREDNISolone  (MEDROL  DOSEPAK) 4 MG TBPK tablet Use as directed on package 09/27/24  Yes Trifan, Donnice PARAS, MD  promethazine-dextromethorphan (PROMETHAZINE-DM) 6.25-15 MG/5ML syrup Take 5 mLs by mouth every 4 (four) hours. 09/28/24  Yes [provider]  acetaZOLAMIDE (DIAMOX) 250 MG tablet Take 1 tablet by mouth 2 (two) times daily. 02/20/24   [provider]  albuterol  (VENTOLIN  HFA) 108 (90 Base) MCG/ACT inhaler Inhale 2 puffs into the lungs every 4 (four) hours as needed for wheezing. 09/27/15   [provider]  amoxicillin -clavulanate (AUGMENTIN ) 875-125 MG tablet Take 1 tablet by mouth every 12 (twelve) hours. 11/17/23   Jerral Meth, MD  DULoxetine (CYMBALTA) 60 MG capsule Take 60 mg by mouth daily. 06/11/22   [provider]  famotidine  (PEPCID ) 20 MG tablet Take 20 mg by mouth daily. Taking prn    [provider]  ferrous sulfate 325 (65 FE) MG tablet Take 325 mg by mouth daily with breakfast.    [provider]  MAGNESIUM PO Take  400 mg by mouth daily.    [provider]  metroNIDAZOLE  (FLAGYL ) 500 MG tablet Take 1 tablet (500 mg total) by mouth 2 (two) times daily. Patient not taking: Reported on 09/30/2024 06/25/24   Neysa Caron PARAS, DO  Multiple Vitamin (QUINTABS) TABS Take 1 tablet by mouth daily.    [provider]  omeprazole (PRILOSEC) 40 MG capsule Take 40 mg by mouth daily as needed (as needed). 10/12/22   [provider]  ondansetron  (ZOFRAN -ODT) 4 MG disintegrating tablet Take 4 mg by  mouth every 8 (eight) hours as needed. 12/31/23   [provider]  SUMAtriptan (IMITREX) 50 MG tablet Take 100 mg by mouth every 2 (two) hours as needed for migraine (as needed for migraines). 09/17/14   [provider]  zolpidem (AMBIEN) 10 MG tablet Take 10 mg by mouth at bedtime as needed for sleep. 06/11/22   [provider]                                                                                                                                    Past Surgical History Past Surgical History:  Procedure Laterality Date   ABDOMINAL HYSTERECTOMY     OOPHORECTOMY Right    TUBAL LIGATION     Family History Family History  Problem Relation Age of Onset   Hyperlipidemia Father    Hypertension Father    Depression Sister    Schizophrenia Maternal Aunt    Depression Maternal Aunt    Stroke Maternal Grandmother    Alcohol abuse Paternal Grandfather    Hypertension Paternal Grandfather    Liver disease Paternal Grandfather    Cancer Paternal Grandmother        kidney    Social History Social History[1] Allergies Benadryl  [diphenhydramine ], Diphenhydramine  hcl, Hydrocortisone, Klonopin [clonazepam], Topiramate, Trazodone, Trazodone and nefazodone, Gabapentin, Lamotrigine, and Pollen extract  Review of Systems Review of Systems  Physical Exam Vital Signs  I have reviewed the triage vital signs BP (!) 122/93   Pulse 92   Temp 99.1 F (37.3 C)   Resp 18   Ht 5' 11 (1.803 m)   Wt 94.3 kg   SpO2 97%   BMI 29.00 kg/m   Physical Exam Vitals and nursing note reviewed.  Constitutional:      Appearance: Normal appearance.  HENT:     Head: Normocephalic and atraumatic.  Eyes:     Pupils: Pupils are equal, round, and reactive to light.  Cardiovascular:     Rate and Rhythm: Tachycardia present.  Pulmonary:     Effort: Pulmonary effort is normal. No respiratory distress.     Breath sounds: No wheezing.  Abdominal:     General: Abdomen is flat.      Palpations: Abdomen is soft.  Musculoskeletal:        General: No swelling or deformity. Normal range of motion.  Skin:    General: Skin is  warm.  Neurological:     General: No focal deficit present.     Mental Status: She is alert and oriented to person, place, and time.     ED Results and Treatments Labs (all labs ordered are listed, but only abnormal results are displayed) Labs Reviewed  COMPREHENSIVE METABOLIC PANEL WITH GFR - Abnormal; Notable for the following components:      Result Value   CO2 20 (*)    Glucose, Bld 101 (*)    Creatinine, Ser 1.06 (*)    All other components within normal limits  LACTIC ACID, PLASMA - Abnormal; Notable for the following components:   Lactic Acid, Venous 2.0 (*)    All other components within normal limits  CBC WITH DIFFERENTIAL/PLATELET - Abnormal; Notable for the following components:   Eosinophils Absolute 1.0 (*)    All other components within normal limits  URINALYSIS, W/ REFLEX TO CULTURE (INFECTION SUSPECTED) - Abnormal; Notable for the following components:   Bacteria, UA RARE (*)    All other components within normal limits                                                                                                                          Radiology DG Chest 2 View if patient is not in a treatment room. Result Date: 09/30/2024 CLINICAL DATA:  Shortness of breath, sepsis. EXAM: CHEST - 2 VIEW COMPARISON:  September 27, 2024. FINDINGS: The heart size and mediastinal contours are within normal limits. Both lungs are clear. The visualized skeletal structures are unremarkable. IMPRESSION: No active cardiopulmonary disease. Electronically Signed   By: Lynwood Landy Raddle M.D.   On: 09/30/2024 15:12    Pertinent labs & imaging results that were available during my care of the patient were reviewed by me and considered in my medical decision making (see MDM for details).  Medications Ordered in ED Medications  sodium chloride  0.9  % bolus 1,000 mL (1,000 mLs Intravenous New Bag/Given 09/30/24 1622)  acetaminophen  (TYLENOL ) tablet 1,000 mg (1,000 mg Oral Given 09/30/24 1611)                                                                                                                                     Procedures Procedures  (including critical care time)  Medical Decision Making / ED Course   This patient presents to the ED for concern of myalgias,  nasal congestion, this involves an extensive number of treatment options, and is a complaint that carries with it a high risk of complications and morbidity.  The differential diagnosis includes viral syndrome, less likely sepsis, less likely serotonin syndrome, less likely pneumonia.  MDM: We are currently experiencing peak volumes of influenza.  My suspicion is the patient likely walked from 1 viral illness into a second.  Suspect she likely has a viral syndrome.  We do not currently have adequate numbers of viral swabs, and the patient does not appear critically ill, so I am foregoing additional viral testing.  I advised patient that the steroid she was taking were probably not helping.  She does not have a history of COPD or asthma.  Her lungs are clear.  Her chest x-ray shows no pneumonia.  Obtain some blood work on the patient show no leukocytosis, mild elevation in her creatinine and a mild lactic acidosis.  Will provide her with a liter of IV fluids, some Tylenol  and plan to discharge.  I discussed these findings with the patient, she was overall reassured.  Reassessment 5:15 PM-heart rate came down nicely with some IV fluids.  Will discharge patient.   Additional history obtained: -Additional history obtained from husband at bedside -External records from outside source obtained and reviewed including: Chart review including previous notes, labs, imaging, consultation notes   Lab Tests: -I ordered, reviewed, and interpreted labs.   The pertinent results  include:   Labs Reviewed  COMPREHENSIVE METABOLIC PANEL WITH GFR - Abnormal; Notable for the following components:      Result Value   CO2 20 (*)    Glucose, Bld 101 (*)    Creatinine, Ser 1.06 (*)    All other components within normal limits  LACTIC ACID, PLASMA - Abnormal; Notable for the following components:   Lactic Acid, Venous 2.0 (*)    All other components within normal limits  CBC WITH DIFFERENTIAL/PLATELET - Abnormal; Notable for the following components:   Eosinophils Absolute 1.0 (*)    All other components within normal limits  URINALYSIS, W/ REFLEX TO CULTURE (INFECTION SUSPECTED) - Abnormal; Notable for the following components:   Bacteria, UA RARE (*)    All other components within normal limits      EKG   EKG Interpretation Date/Time:  Wednesday September 30 2024 14:22:46 EST Ventricular Rate:  113 PR Interval:  130 QRS Duration:  80 QT Interval:  317 QTC Calculation: 435 R Axis:   73  Text Interpretation: Sinus tachycardia Baseline wander in lead(s) V3 V6 Confirmed by Mannie Pac [phone]) on 09/30/2024 3:40:43 PM         Imaging Studies ordered: I ordered imaging studies including chest x-ray I independently visualized and interpreted imaging. I agree with the radiologist interpretation   Medicines ordered and prescription drug management: Meds ordered this encounter  Medications   sodium chloride  0.9 % bolus 1,000 mL   acetaminophen  (TYLENOL ) tablet 1,000 mg    -I have reviewed the patients home medicines and have made adjustments as needed   Cardiac Monitoring: The patient was maintained on a cardiac monitor.  I personally viewed and interpreted the cardiac monitored which showed an underlying rhythm of: Normal sinus rhythm  Social Determinants of Health:  Factors impacting patients care include: Lack of access to primary care   Reevaluation: After the interventions noted above, I reevaluated the patient and found that they have  :improved  Co morbidities that complicate the patient evaluation  Past Medical  History:  Diagnosis Date   Anxiety    Depression    Dysautonomia (HCC)    Hypoglycemia    Migraines    POTS (postural orthostatic tachycardia syndrome)    Tachycardia    TBI (traumatic brain injury) (HCC)       Dispostion: I considered admission for this patient, however with her reassuring workup and improvement she is appropriate for discharge.     Final Clinical Impression(s) / ED Diagnoses Final diagnoses:  Viral syndrome     @PCDICTATION @     [1]  Social History Tobacco Use   Smoking status: Never   Smokeless tobacco: Never  Vaping Use   Vaping status: Never Used  Substance Use Topics   Alcohol use: No   Drug use: No     Mannie Pac T, DO 09/30/24 1719  "
